# Patient Record
Sex: Female | Born: 1945
Health system: Southern US, Community
[De-identification: ages and names within clinical notes are randomized; demographics above are authoritative.]

## PROBLEM LIST (undated history)

## (undated) DIAGNOSIS — I4891 Unspecified atrial fibrillation: Secondary | ICD-10-CM

## (undated) DIAGNOSIS — I10 Essential (primary) hypertension: Secondary | ICD-10-CM

## (undated) DIAGNOSIS — I471 Supraventricular tachycardia, unspecified: Secondary | ICD-10-CM

## (undated) DIAGNOSIS — Z8679 Personal history of other diseases of the circulatory system: Secondary | ICD-10-CM

## (undated) DIAGNOSIS — E7800 Pure hypercholesterolemia, unspecified: Secondary | ICD-10-CM

## (undated) DIAGNOSIS — R911 Solitary pulmonary nodule: Secondary | ICD-10-CM

## (undated) DIAGNOSIS — J45909 Unspecified asthma, uncomplicated: Secondary | ICD-10-CM

## (undated) DIAGNOSIS — E785 Hyperlipidemia, unspecified: Secondary | ICD-10-CM

## (undated) DIAGNOSIS — M8588 Other specified disorders of bone density and structure, other site: Secondary | ICD-10-CM

## (undated) HISTORY — DX: Supraventricular tachycardia, unspecified: I47.10

## (undated) HISTORY — DX: Unspecified atrial fibrillation: I48.91

## (undated) HISTORY — DX: Pure hypercholesterolemia, unspecified: E78.00

## (undated) HISTORY — PX: TUBAL LIGATION: SHX77

## (undated) HISTORY — DX: Hyperlipidemia, unspecified: E78.5

## (undated) HISTORY — DX: Solitary pulmonary nodule: R91.1

## (undated) HISTORY — DX: Personal history of other diseases of the circulatory system: Z86.79

## (undated) HISTORY — DX: Other specified disorders of bone density and structure, other site: M85.88

## (undated) HISTORY — DX: Supraventricular tachycardia: I47.1

---

## 2008-08-06 ENCOUNTER — Encounter: Admission: RE | Admit: 2008-08-06 | Discharge: 2008-08-06 | Payer: Self-pay | Admitting: Internal Medicine

## 2008-10-28 ENCOUNTER — Other Ambulatory Visit: Admission: RE | Admit: 2008-10-28 | Discharge: 2008-10-28 | Payer: Self-pay | Admitting: Internal Medicine

## 2009-08-24 ENCOUNTER — Encounter: Admission: RE | Admit: 2009-08-24 | Discharge: 2009-08-24 | Payer: Self-pay | Admitting: Internal Medicine

## 2010-03-23 ENCOUNTER — Encounter: Admission: RE | Admit: 2010-03-23 | Discharge: 2010-03-23 | Payer: Self-pay | Admitting: Internal Medicine

## 2010-08-07 ENCOUNTER — Other Ambulatory Visit: Payer: Self-pay | Admitting: Internal Medicine

## 2010-08-07 DIAGNOSIS — Z1239 Encounter for other screening for malignant neoplasm of breast: Secondary | ICD-10-CM

## 2010-08-07 DIAGNOSIS — Z1231 Encounter for screening mammogram for malignant neoplasm of breast: Secondary | ICD-10-CM

## 2010-08-25 ENCOUNTER — Ambulatory Visit
Admission: RE | Admit: 2010-08-25 | Discharge: 2010-08-25 | Disposition: A | Discharge status: Home / Self Care | Payer: BC Managed Care – PPO | Source: Ambulatory Visit | Attending: Internal Medicine | Admitting: Internal Medicine

## 2010-08-25 DIAGNOSIS — Z1231 Encounter for screening mammogram for malignant neoplasm of breast: Secondary | ICD-10-CM

## 2011-02-08 ENCOUNTER — Other Ambulatory Visit (HOSPITAL_COMMUNITY)
Admission: RE | Admit: 2011-02-08 | Discharge: 2011-02-08 | Disposition: A | Discharge status: Home / Self Care | Payer: BC Managed Care – PPO | Source: Ambulatory Visit | Attending: Internal Medicine | Admitting: Internal Medicine

## 2011-02-08 ENCOUNTER — Other Ambulatory Visit: Payer: Self-pay | Admitting: Internal Medicine

## 2011-02-08 DIAGNOSIS — Z1159 Encounter for screening for other viral diseases: Secondary | ICD-10-CM | POA: Insufficient documentation

## 2011-02-08 DIAGNOSIS — Z01419 Encounter for gynecological examination (general) (routine) without abnormal findings: Secondary | ICD-10-CM | POA: Insufficient documentation

## 2011-07-18 ENCOUNTER — Other Ambulatory Visit: Payer: Self-pay | Admitting: Internal Medicine

## 2011-07-18 DIAGNOSIS — Z1231 Encounter for screening mammogram for malignant neoplasm of breast: Secondary | ICD-10-CM

## 2011-08-27 ENCOUNTER — Ambulatory Visit
Admission: RE | Admit: 2011-08-27 | Discharge: 2011-08-27 | Disposition: A | Discharge status: Home / Self Care | Payer: BC Managed Care – PPO | Source: Ambulatory Visit | Attending: Internal Medicine | Admitting: Internal Medicine

## 2011-08-27 DIAGNOSIS — Z1231 Encounter for screening mammogram for malignant neoplasm of breast: Secondary | ICD-10-CM

## 2012-01-07 ENCOUNTER — Emergency Department (HOSPITAL_COMMUNITY): Payer: BC Managed Care – PPO

## 2012-01-07 ENCOUNTER — Observation Stay (HOSPITAL_COMMUNITY)
Admission: EM | Admit: 2012-01-07 | Discharge: 2012-01-08 | Disposition: A | Discharge status: Home / Self Care | Payer: BC Managed Care – PPO | Attending: Internal Medicine | Admitting: Internal Medicine

## 2012-01-07 ENCOUNTER — Other Ambulatory Visit: Payer: Self-pay

## 2012-01-07 ENCOUNTER — Encounter (HOSPITAL_COMMUNITY): Payer: Self-pay | Admitting: *Deleted

## 2012-01-07 DIAGNOSIS — I471 Supraventricular tachycardia, unspecified: Secondary | ICD-10-CM | POA: Diagnosis present

## 2012-01-07 DIAGNOSIS — I10 Essential (primary) hypertension: Secondary | ICD-10-CM | POA: Diagnosis present

## 2012-01-07 DIAGNOSIS — R0602 Shortness of breath: Secondary | ICD-10-CM | POA: Insufficient documentation

## 2012-01-07 DIAGNOSIS — I498 Other specified cardiac arrhythmias: Principal | ICD-10-CM | POA: Insufficient documentation

## 2012-01-07 DIAGNOSIS — J45909 Unspecified asthma, uncomplicated: Secondary | ICD-10-CM | POA: Diagnosis present

## 2012-01-07 HISTORY — DX: Unspecified asthma, uncomplicated: J45.909

## 2012-01-07 HISTORY — DX: Essential (primary) hypertension: I10

## 2012-01-07 LAB — CBC
HCT: 38.5 % (ref 36.0–46.0)
MCH: 24.3 pg — ABNORMAL LOW (ref 26.0–34.0)
MCV: 75.9 fL — ABNORMAL LOW (ref 78.0–100.0)
RBC: 5.07 MIL/uL (ref 3.87–5.11)
RDW: 13.6 % (ref 11.5–15.5)
WBC: 8.8 10*3/uL (ref 4.0–10.5)

## 2012-01-07 LAB — DIFFERENTIAL
Basophils Absolute: 0 10*3/uL (ref 0.0–0.1)
Neutro Abs: 5.2 10*3/uL (ref 1.7–7.7)

## 2012-01-07 LAB — POCT I-STAT, CHEM 8
Creatinine, Ser: 1.3 mg/dL — ABNORMAL HIGH (ref 0.50–1.10)
Glucose, Bld: 128 mg/dL — ABNORMAL HIGH (ref 70–99)
Hemoglobin: 13.6 g/dL (ref 12.0–15.0)
Potassium: 4.3 mEq/L (ref 3.5–5.1)

## 2012-01-07 LAB — APTT: aPTT: 29 seconds (ref 24–37)

## 2012-01-07 LAB — PROTIME-INR: Prothrombin Time: 12.8 seconds (ref 11.6–15.2)

## 2012-01-07 MED ORDER — ADENOSINE 6 MG/2ML IV SOLN
INTRAVENOUS | Status: AC
  Filled 2012-01-07: qty 8

## 2012-01-07 MED ORDER — ADENOSINE 6 MG/2ML IV SOLN
12.0000 mg | Freq: Once | INTRAVENOUS | Status: AC
  Administered 2012-01-07: 12 mg via INTRAVENOUS

## 2012-01-07 MED ORDER — ASPIRIN 81 MG PO CHEW
324.0000 mg | CHEWABLE_TABLET | Freq: Once | ORAL | Status: AC
  Administered 2012-01-07: 324 mg via ORAL
  Filled 2012-01-07: qty 4

## 2012-01-07 MED ORDER — ADENOSINE 6 MG/2ML IV SOLN
6.0000 mg | Freq: Once | INTRAVENOUS | Status: AC
  Administered 2012-01-07: 6 mg via INTRAVENOUS

## 2012-01-07 NOTE — ED Provider Notes (Signed)
History     CSN: [id number]  Arrival date & time 01/07/12  2126   First MD Initiated Contact with Patient 01/07/12 2142      Chief Complaint  Patient presents with  . Tachycardia    (Consider location/radiation/quality/duration/timing/severity/associated sxs/prior treatment) HPI  66 year old female with past meniscal medical history of hypertension and asthma presents today with a 3 hour history of some shortness of breath and very rapid heart rate approximately 3 hours before arrival. The patient denies any frank chest pain, jaw pain or arm pain. The patient denies any diaphoresis. She does endorse palpitations and a sense of shivering of her heart. She has never had this before. She endorses no recent illnesses. She's had no recent cough. She calls her illness moderate. On arrival her heart rate is approximately 185 beats per minute. Her pain is 0/10. Nothing makes her symptoms better or worse.  Past Medical History  Diagnosis Date  . Hypertension   . Asthma     Past Surgical History  Procedure Date  . Tubal ligation circa 1975    Family History  Problem Relation Age of Onset  . Hypertension Mother   . Dementia Mother   . Hypertension Father   . Dementia Father   . Diabetes Father     History  Substance Use Topics  . Smoking status: Never Smoker   . Smokeless tobacco: Not on file  . Alcohol Use: No    OB History    Grav Para Term Preterm Abortions TAB SAB Ect Mult Living                  Review of Systems Constitutional: Negative for fever and chills.  HENT: Negative for ear pain, sore throat and trouble swallowing.   Eyes: Negative for pain and visual disturbance.  Respiratory: Negative for cough and POS mild shortness of breath.   Cardiovascular: Negative for chest pain and leg swelling.  Gastrointestinal: Negative for nausea, vomiting, abdominal pain and diarrhea.  Genitourinary: Negative for dysuria, urgency and frequency.  Musculoskeletal: Negative  for back pain and joint swelling.  Skin: Negative for rash and wound.  Neurological: Negative for dizziness, syncope, speech difficulty, weakness and numbness.   Allergies  Fish allergy and Milk-related compounds  Home Medications   Current Outpatient Rx  Name Route Sig Dispense Refill  . ALBUTEROL SULFATE HFA 108 (90 BASE) MCG/ACT IN AERS Inhalation Inhale 2 puffs into the lungs every 6 (six) hours as needed. For wheezing    . ASPIRIN EC 81 MG PO TBEC Oral Take 81 mg by mouth daily.    Marland Kitchen CALCIUM CARBONATE 1250 MG PO TABS Oral Take 1 tablet by mouth 3 (three) times daily.    Marland Kitchen VITAMIN D 1000 UNITS PO TABS Oral Take 1,000 Units by mouth daily.    Marland Kitchen FLUTICASONE-SALMETEROL 250-50 MCG/DOSE IN AEPB Inhalation Inhale 1 puff into the lungs every 12 (twelve) hours.    Marland Kitchen LISINOPRIL-HYDROCHLOROTHIAZIDE 20-12.5 MG PO TABS Oral Take 2 tablets by mouth daily.      BP 122/72  Pulse 94  Temp 97.7 F (36.5 C) (Oral)  Resp 11  SpO2 100%  Physical Exam Consitutional: Pt in no acute distress.   Head: Normocephalic and atraumatic.  Eyes: Extraocular motion intact, no scleral icterus Neck: Supple without meningismus, mass, or overt JVD Respiratory: Effort normal and breath sounds normal. No respiratory distress. [id number], no obvious murmurs.  Pulses +2 and symmetric Abdomen: Soft, non-tender, non-distended. No rebound or guarding.  MSK: Extremities  are atraumatic without deformity, ROM intact Skin: Warm, dry, intact Neuro: Alert and oriented, no motor deficit noted.   Psychiatric: Mood and affect are normal  EKG:  Rate:  195 Rythym SVT  Interval: n/a Axis: normal No gross conduction abnormalities appreciated.  STD V3-V6.       ED Course  Procedures (including critical care time)  Labs Reviewed  CBC - Abnormal; Notable for the following:    MCV 75.9 (*)     MCH 24.3 (*)     All other components within normal limits  POCT I-STAT, CHEM 8 - Abnormal; Notable for the following:     Creatinine, Ser 1.30 (*)     Glucose, Bld 128 (*)     All other components within normal limits  DIFFERENTIAL  PROTIME-INR  APTT  POCT I-STAT TROPONIN I  TSH  CARDIAC PANEL(CRET KIN+CKTOT+MB+TROPI)  CARDIAC PANEL(CRET KIN+CKTOT+MB+TROPI)  CARDIAC PANEL(CRET KIN+CKTOT+MB+TROPI)  TSH   Dg Chest Portable 1 View  01/07/2012  *RADIOLOGY REPORT*  Clinical Data: Tachycardia  PORTABLE CHEST - 1 VIEW  Comparison: 03/23/2010  Findings: Mild hyperinflation.  Normal heart size and pulmonary vascularity.  No focal airspace consolidation in the lungs.  No blunting of costophrenic angles.  No pneumothorax.  No significant change since previous study.  IMPRESSION: No evidence of active pulmonary disease.  Original Report Authenticated By: Neale Burly, M.D.    Impression: SVT.    MDM  Initial EKG was consistent with a supraventricular tachycardia with some lateral ischemia likely secondary to the very fast heart rate of 195 beats per minute. However the patient had no discomfort. 6 mg of adenosine were given after failure of medical maneuvers.  She recently converted, and she once again is tachycardic. Additional 12 mg adenosine was given this he was successfully shortly, and the patient converted into SVT. Her rate eventually moderated.  Her laboratory values and chest x-ray were completed and were unremarkable other than mildly elevated Cr.  Patient was admitted to medicine uneventfully.          Edwin Cap, MD 01/08/12 0110

## 2012-01-07 NOTE — ED Notes (Signed)
The pt has had some chest pain with sob and a rapid heart rate this sfternoon.

## 2012-01-07 NOTE — ED Notes (Signed)
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 Pt on monitors, defibrillator at bedside. Dr Karle Starch at bedside.

## 2012-01-08 ENCOUNTER — Encounter (HOSPITAL_COMMUNITY): Payer: Self-pay | Admitting: Internal Medicine

## 2012-01-08 DIAGNOSIS — I471 Supraventricular tachycardia: Secondary | ICD-10-CM | POA: Diagnosis present

## 2012-01-08 DIAGNOSIS — J45909 Unspecified asthma, uncomplicated: Secondary | ICD-10-CM | POA: Diagnosis present

## 2012-01-08 DIAGNOSIS — I10 Essential (primary) hypertension: Secondary | ICD-10-CM

## 2012-01-08 HISTORY — DX: Essential (primary) hypertension: I10

## 2012-01-08 LAB — CARDIAC PANEL(CRET KIN+CKTOT+MB+TROPI)
CK, MB: 4.7 ng/mL — ABNORMAL HIGH (ref 0.3–4.0)
Relative Index: 2.5 (ref 0.0–2.5)
Relative Index: 2.6 — ABNORMAL HIGH (ref 0.0–2.5)
Relative Index: 2.9 — ABNORMAL HIGH (ref 0.0–2.5)
Total CK: 188 U/L — ABNORMAL HIGH (ref 7–177)
Total CK: 190 U/L — ABNORMAL HIGH (ref 7–177)

## 2012-01-08 LAB — COMPREHENSIVE METABOLIC PANEL
AST: 25 U/L (ref 0–37)
BUN: 18 mg/dL (ref 6–23)
CO2: 26 mEq/L (ref 19–32)
Chloride: 103 mEq/L (ref 96–112)
Creatinine, Ser: 1.09 mg/dL (ref 0.50–1.10)
GFR calc Af Amer: 59 mL/min — ABNORMAL LOW (ref >90)
GFR calc non Af Amer: 51 mL/min — ABNORMAL LOW (ref >90)
Glucose, Bld: 87 mg/dL (ref 70–99)
Total Bilirubin: 0.4 mg/dL (ref 0.3–1.2)

## 2012-01-08 LAB — TSH: TSH: 3.109 u[IU]/mL (ref 0.350–4.500)

## 2012-01-08 MED ORDER — SODIUM CHLORIDE 0.9 % IV SOLN: INTRAVENOUS | Status: AC

## 2012-01-08 MED ORDER — SODIUM CHLORIDE 0.9 % IV SOLN
INTRAVENOUS | Status: DC
  Administered 2012-01-08: 02:00:00 via INTRAVENOUS

## 2012-01-08 MED ORDER — ASPIRIN EC 81 MG PO TBEC
81.0000 mg | DELAYED_RELEASE_TABLET | Freq: Every day | ORAL | Status: DC
  Administered 2012-01-08: 81 mg via ORAL
  Filled 2012-01-08: qty 1

## 2012-01-08 MED ORDER — FLUTICASONE-SALMETEROL 250-50 MCG/DOSE IN AEPB
1.0000 | INHALATION_SPRAY | Freq: Two times a day (BID) | RESPIRATORY_TRACT | Status: DC
  Administered 2012-01-08: 1 via RESPIRATORY_TRACT
  Filled 2012-01-08: qty 14

## 2012-01-08 MED ORDER — DILTIAZEM HCL ER COATED BEADS 120 MG PO CP24: 120.0000 mg | ORAL_CAPSULE | Freq: Every day | ORAL | Status: DC

## 2012-01-08 MED ORDER — ALBUTEROL SULFATE HFA 108 (90 BASE) MCG/ACT IN AERS: 2.0000 | INHALATION_SPRAY | Freq: Four times a day (QID) | RESPIRATORY_TRACT | Status: DC | PRN

## 2012-01-08 MED ORDER — VITAMIN D3 25 MCG (1000 UNIT) PO TABS
1000.0000 [IU] | ORAL_TABLET | Freq: Every day | ORAL | Status: DC
  Administered 2012-01-08: 1000 [IU] via ORAL
  Filled 2012-01-08: qty 1

## 2012-01-08 MED ORDER — DILTIAZEM HCL ER COATED BEADS 120 MG PO CP24
120.0000 mg | ORAL_CAPSULE | Freq: Every day | ORAL | Status: DC
  Administered 2012-01-08: 120 mg via ORAL
  Filled 2012-01-08: qty 1

## 2012-01-08 MED ORDER — CALCIUM CARBONATE 1250 (500 CA) MG PO TABS
1.0000 | ORAL_TABLET | Freq: Three times a day (TID) | ORAL | Status: DC
  Administered 2012-01-08 (×3): 500 mg via ORAL
  Filled 2012-01-08 (×4): qty 1

## 2012-01-08 NOTE — Consult Note (Addendum)
Admit date: 01/07/2012 Referring Physician  : Dr. Grandville Silos Primary Physician Kandice Hams, MD Primary Cardiologist  None Reason for Consultation  SVT  HPI: 66 year old female with no prior cardiovascular history, with a history of prior asthma, hypertension who had newly discovered supraventricular tachycardia yesterday in the emergency department requiring adenosine.   Over the weekend she drove to [place] to help her sister move and had no difficulty. 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 She sat down drink some water and try to calm down. She then became weaker and weaker. She then realized that her heart was beating quickly. She tried to take her blood pressure but then called her daughter and told her that she should go to the emergency room. Once in the emergency room an EKG was performed which showed a heart rate of 195 beats per minute with diffuse ST segment depression, no clear P waves preceding QRS complex, narrow complex tachycardia. She was given adenosine 6 mg and converted. She states that shortly after this she required a second 12 mg dose. She was placed on diltiazem 120 mg long-acting oral. Her lisinopril hydrochlorothiazide was held.  She does not drink alcohol, no recent viral illnesses, no cough, no fevers. She is not taking any Sudafed or other decongestants. She is currently feeling well.  Her lab work demonstrated a normal potassium of 4.0, creatinine 0.09, albumin 3.4, liver functions otherwise normal. Her troponin has been normal x3. Her CK was 190 and her MB was 5.0 mildly elevated. Her white count was 8.8, normal and her hemoglobin was 13.6. TSH was normal at 3.1. Chest x-ray showed no active disease    PMH:   Past Medical History  Diagnosis Date  . Hypertension   . Asthma   . HTN (hypertension) 01/08/2012    PSH:   Past Surgical History  Procedure Date  . Tubal ligation  circa 1975   Allergies:  Fish allergy and Milk-related compounds Prior to Admit Meds:   Prescriptions prior to admission  Medication Sig Dispense Refill  . albuterol (PROVENTIL HFA;VENTOLIN HFA) 108 (90 BASE) MCG/ACT inhaler Inhale 2 puffs into the lungs every 6 (six) hours as needed. For wheezing      . aspirin EC 81 MG tablet Take 81 mg by mouth daily.      . calcium carbonate (OS-CAL - DOSED IN MG OF ELEMENTAL CALCIUM) 1250 MG tablet Take 1 tablet by mouth 3 (three) times daily.      . cholecalciferol (VITAMIN D) 1000 UNITS tablet Take 1,000 Units by mouth daily.      . Fluticasone-Salmeterol (ADVAIR) 250-50 MCG/DOSE AEPB Inhale 1 puff into the lungs every 12 (twelve) hours.      Marland Kitchen lisinopril-hydrochlorothiazide (PRINZIDE,ZESTORETIC) 20-12.5 MG per tablet Take 2 tablets by mouth daily.       Fam HX:    Family History  Problem Relation Age of Onset  . Hypertension Mother   . Dementia Mother   . Hypertension Father   . Dementia Father   . Diabetes Father    no early family history of coronary artery disease Social HX:    History   Social History  . Marital Status: Unknown    Spouse Name: N/A    Number of Children: N/A  . Years of Education: N/A   Occupational History  . Not on file.   Social History Main Topics  . Smoking status: Never Smoker   . Smokeless  tobacco: Not on file  . Alcohol Use: No  . Drug Use: No  . Sexually Active: Not on file   Other Topics Concern  . 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 rate 16, height 5\' 1"  (1.549 m), weight 71.7 kg (158 lb 1.1 oz), SpO2 99.00%.    General: Well developed, well nourished, in no acute distress Head: Eyes PERRLA, No xanthomas.    Normal cephalic and atramatic  Lungs:   Clear bilaterally to auscultation and percussion. Normal respiratory effort. No wheezes, no rales. Heart:   HRRR S1 S2 Pulses are 2+ & equal. No appreciable murmurs.            No carotid bruit. No JVD.  No abdominal bruits. No femoral bruits. Abdomen: Bowel sounds are positive, abdomen soft and non-tender without masses. No hepatosplenomegaly. Msk:  Back normal. Normal strength and tone for age. Extremities:   No clubbing, cyanosis or edema.  DP +1 Neuro: Alert and oriented X 3, non-focal, MAE x 4 GU: Deferred Rectal: Deferred Psych:  Good affect, responds appropriately    Labs:   Lab Results  Component Value Date   WBC 8.8 01/07/2012   HGB 13.6 01/07/2012   HCT 40.0 01/07/2012   MCV 75.9* 01/07/2012   PLT 211 01/07/2012    Lab 01/08/12 0655  NA 138  K 4.0  CL 103  CO2 26  BUN 18  CREATININE 1.09  CALCIUM 9.7  PROT 7.0  BILITOT 0.4  ALKPHOS 75  ALT 13  AST 25  GLUCOSE 87   No results found for this basename: PTT   Lab Results  Component Value Date   INR 0.94 01/07/2012   Lab Results  Component Value Date   CKTOTAL 190* 01/08/2012   CKMB 5.0* 01/08/2012   TROPONINI <0.30 01/08/2012      Radiology:  Dg Chest Portable 1 View  01/07/2012  *RADIOLOGY REPORT*  Clinical Data: Tachycardia  PORTABLE CHEST - 1 VIEW  Comparison: 03/23/2010  Findings: Mild hyperinflation.  Normal heart size and pulmonary vascularity.  No focal airspace consolidation in the lungs.  No blunting of costophrenic angles.  No pneumothorax.  No significant change since previous study.  IMPRESSION: No evidence of active pulmonary disease.  Original Report Authenticated By: Neale Burly, M.D.   Personally viewed.  EKG:  As described above, rightward axis narrow complex QRS, diffuse ST segment depression, no clear P waves, perhaps small retrograde P wave noted very difficult to discern. Heart rate 195.  Followup EKG shows sinus tachycardia rate 121 with  nonspecific ST-T wave changes, possible repolarization abnormality in the lateral leads. Normal axis. Personally viewed.   Currently telemetry shows sinus rhythm with heart rate in the 60s.  ASSESSMENT/PLAN:   66 year old female with prior history of asthma, newly discovered supraventricular tachycardia which responded to adenosine.  Supraventricular tachycardia-SVT-I agree with current use of diltiazem CD 120 mg once a day. This is her first experience with SVT requiring hospitalization. She may have had brief episodes of palpitations in the past but she is unsure. Her episode yesterday was accompanied by diaphoresis and a sensation of her heart racing. Thus far, troponin is normal. TSH is normal.  No obvious electrolyte abnormalities. Currently waiting echocardiogram however this may be performed as an outpatient. Will discuss with her primary caregiver here in the hospital, Dr. Grandville Silos.  If she has repeat episodes on medication, I will likely refer to electrophysiology for ablative procedure. I've instructed her to avoid decongestants such as Sudafed.  Close followup has been obtained with the office. I will also obtain an echocardiogram tomorrow morning to assess for proper structure and function. She knows to contact me if any further worrisome symptoms develop in relation to her heart.  Hypertension-her other antihypertensives have been discontinued because of new start diltiazem. Continue to monitor. She may need reinitiation of these medications. I would have her followup with her primary physician, Dr. Delfina Redwood in the future.  Candee Furbish, MD  01/08/2012  12:50 PM    I've spoken with Dr. Grandville Silos and I'm fine for her to be discharged given her stability. As is stated above, I have set up an echocardiogram and followup as well. I discussed this with the patient and she is fine with this plan.

## 2012-01-08 NOTE — H&P (Signed)
Helen Stanton is an 66 y.o. female.   Chief Complaint: palpitations HPI: 66 yo female with htn with complaints of diaphoresis, feeling weak and then palpitations at about 6:30pm .  Pt notes slight cp, fleeting, right sided and also sob.  Pt was brought by her daughter to the ED and found to be in SVT  And tx with adenosine 6mg  and then had recurrent SVT with hr 190's and tx with adenosine 12mg  iv x1 and then converted to NSR. Pt denies excessive caffeine use, or extra albuterol use,  Pt will be admitted for SVT.   Past Medical History  Diagnosis Date  . Hypertension   . Asthma     Past Surgical History  Procedure Date  . Tubal ligation circa 1975    Family History  Problem Relation Age of Onset  . Hypertension Mother   . Dementia Mother   . Hypertension Father   . Dementia Father   . Diabetes Father    Social History:  reports that she has never smoked. She does not have any smokeless tobacco history on file. She reports that she does not drink alcohol or use illicit drugs.  Allergies:  Allergies  Allergen Reactions  . Fish Allergy Shortness Of Breath  . Milk-Related Compounds Shortness Of Breath     (Not in a hospital admission)  Results for orders placed during the hospital encounter of 01/07/12 (from the past 48 hour(s))  CBC     Status: Abnormal   Collection Time   01/07/12 10:00 PM      Component Value Range Comment   WBC 8.8  4.0 - 10.5 K/uL    RBC 5.07  3.87 - 5.11 MIL/uL    Hemoglobin 12.3  12.0 - 15.0 g/dL    HCT 38.5  36.0 - 46.0 %    MCV 75.9 (*) 78.0 - 100.0 fL    MCH 24.3 (*) 26.0 - 34.0 pg    MCHC 31.9  30.0 - 36.0 g/dL    RDW 13.6  11.5 - 15.5 %    Platelets 211  150 - 400 K/uL   DIFFERENTIAL     Status: Normal   Collection Time   01/07/12 10:00 PM      Component Value Range Comment   Neutrophils Relative 60  43 - 77 %    Neutro Abs 5.2  1.7 - 7.7 K/uL    Lymphocytes Relative 30  12 - 46 %    Lymphs Abs 2.7  0.7 - 4.0 K/uL    Monocytes Relative 6   3 - 12 %    Monocytes Absolute 0.6  0.1 - 1.0 K/uL    Eosinophils Relative 4  0 - 5 %    Eosinophils Absolute 0.3  0.0 - 0.7 K/uL    Basophils Relative 0  0 - 1 %    Basophils Absolute 0.0  0.0 - 0.1 K/uL   PROTIME-INR     Status: Normal   Collection Time   01/07/12 10:00 PM      Component Value Range Comment   Prothrombin Time 12.8  11.6 - 15.2 seconds    INR 0.94  0.00 - 1.49   APTT     Status: Normal   Collection Time   01/07/12 10:00 PM      Component Value Range Comment   aPTT 29  24 - 37 seconds   POCT I-STAT, CHEM 8     Status: Abnormal   Collection Time   01/07/12 10:16  PM      Component Value Range Comment   Sodium 138  135 - 145 mEq/L    Potassium 4.3  3.5 - 5.1 mEq/L    Chloride 102  96 - 112 mEq/L    BUN 21  6 - 23 mg/dL    Creatinine, Ser 1.30 (*) 0.50 - 1.10 mg/dL    Glucose, Bld 128 (*) 70 - 99 mg/dL    Calcium, Ion 1.26  1.12 - 1.32 mmol/L    TCO2 25  0 - 100 mmol/L    Hemoglobin 13.6  12.0 - 15.0 g/dL    HCT 40.0  36.0 - 46.0 %   POCT I-STAT TROPONIN I     Status: Normal   Collection Time   01/07/12 11:26 PM      Component Value Range Comment   Troponin i, poc 0.03  0.00 - 0.08 ng/mL    Comment 3             Dg Chest Portable 1 View  01/07/2012  *RADIOLOGY REPORT*  Clinical Data: Tachycardia  PORTABLE CHEST - 1 VIEW  Comparison: 03/23/2010  Findings: Mild hyperinflation.  Normal heart size and pulmonary vascularity.  No focal airspace consolidation in the lungs.  No blunting of costophrenic angles.  No pneumothorax.  No significant change since previous study.  IMPRESSION: No evidence of active pulmonary disease.  Original Report Authenticated By: Neale Burly, M.D.    Review of Systems  Constitutional: Negative for fever, chills, weight loss, malaise/fatigue and diaphoresis.  HENT: Negative for hearing loss, ear pain, nosebleeds, congestion, sore throat, neck pain, tinnitus and ear discharge.   Eyes: Negative for blurred vision, double vision,  photophobia, pain, discharge and redness.  Respiratory: Positive for shortness of breath. Negative for cough, hemoptysis, sputum production, wheezing and stridor.   Cardiovascular: Positive for chest pain and palpitations. Negative for orthopnea, claudication, leg swelling and PND.  Gastrointestinal: Negative for heartburn, nausea, vomiting, abdominal pain, diarrhea, constipation, blood in stool and melena.  Genitourinary: Negative for dysuria, urgency, frequency, hematuria and flank pain.  Musculoskeletal: Negative for myalgias, back pain, joint pain and falls.  Skin: Negative for itching and rash.  Neurological: Negative for dizziness, tingling, tremors, sensory change, speech change, focal weakness, seizures, loss of consciousness, weakness and headaches.  Endo/Heme/Allergies: Negative for environmental allergies and polydipsia. Does not bruise/bleed easily.  Psychiatric/Behavioral: Negative for depression, suicidal ideas, hallucinations, memory loss and substance abuse. The patient is not nervous/anxious and does not have insomnia.     Blood pressure 111/70, pulse 94, temperature 97.7 F (36.5 C), temperature source Oral, resp. rate 11, SpO2 100.00%. Physical Exam  Constitutional: She is oriented to person, place, and time. She appears well-developed and well-nourished. No distress.  HENT:  Head: Normocephalic and atraumatic.  Nose: Nose normal.  Mouth/Throat: No oropharyngeal exudate.  Eyes: Conjunctivae and EOM are normal. Pupils are equal, round, and reactive to light. Right eye exhibits no discharge. Left eye exhibits no discharge. No scleral icterus.  Neck: Normal range of motion. Neck supple. No JVD present. No tracheal deviation present. No thyromegaly present.  Cardiovascular: Normal rate, regular rhythm and normal heart sounds.  Exam reveals no gallop and no friction rub.   No murmur heard. Respiratory: Effort normal and breath sounds normal. No stridor. No respiratory distress.  She has no wheezes. She has no rales. She exhibits no tenderness.  GI: Soft. Bowel sounds are normal. She exhibits no distension and no mass. There is no tenderness. There is  no rebound and no guarding.  Musculoskeletal: Normal range of motion. She exhibits no edema and no tenderness.  Lymphadenopathy:    She has no cervical adenopathy.  Neurological: She is alert and oriented to person, place, and time. She has normal reflexes. She displays normal reflexes. No cranial nerve deficit. She exhibits normal muscle tone. Coordination normal.  Skin: Skin is warm and dry. No rash noted. She is not diaphoretic. No erythema. No pallor.  Psychiatric: She has a normal mood and affect. Her behavior is normal. Judgment and thought content normal.     Assessment/Plan SVT Hypertension Asthma  Tele TSH Check echo Check cpk, mb, trop i q6h x 3  Cont Advair D/c lisinopril hydrochlorothiazide and will implement cardizem 90mg  po qday.    Jani Gravel 01/08/2012, 12:35 AM

## 2012-01-08 NOTE — Discharge Summary (Signed)
Discharge Summary  Brie Gowell MR#: [id number]  DOB:01-29-1946  Date of Admission: 01/07/2012 Date of Discharge: 01/08/2012  Patient's PCP: Kandice Hams, MD  Attending Physician:Nayleen Janosik  Consults: Treatment Team: #1 cardiology:Mark Marlou Porch, MD   Discharge Diagnoses: SVT (supraventricular tachycardia) Present on Admission:  .SVT (supraventricular tachycardia) .HTN (hypertension) .Asthma   Brief Admitting History and Physical 66 yo female with htn with complaints of diaphoresis, feeling weak and then palpitations at about 6:30pm . Pt notes slight cp, fleeting, right sided and also sob. Pt was brought by her daughter to the ED and found to be in SVT And tx with adenosine 6mg  and then had recurrent SVT with hr 190's and tx with adenosine 12mg  iv x1 and then converted to NSR. Pt denies excessive caffeine use, or extra albuterol use, Pt will be admitted for SVT. For the rest of admission history and physical please see H&P dictated by Dr. Maudie Mercury.   Discharge Medications Medication List  As of 01/08/2012  9:26 PM   START taking these medications         diltiazem 120 MG 24 hr capsule   Commonly known as: CARDIZEM CD   Take 1 capsule (120 mg total) by mouth daily.         CONTINUE taking these medications         albuterol 108 (90 BASE) MCG/ACT inhaler   Commonly known as: PROVENTIL HFA;VENTOLIN HFA      aspirin EC 81 MG tablet      calcium carbonate 1250 MG tablet   Commonly known as: OS-CAL - dosed in mg of elemental calcium      cholecalciferol 1000 UNITS tablet   Commonly known as: VITAMIN D      Fluticasone-Salmeterol 250-50 MCG/DOSE Aepb   Commonly known as: ADVAIR         STOP taking these medications         lisinopril-hydrochlorothiazide 20-12.5 MG per tablet          Where to get your medications    These are the prescriptions that you need to pick up.   You may get these medications from any pharmacy.         diltiazem 120 MG 24 hr capsule           Hospital Course: #1:SVT (supraventricular tachycardia) Patient was admitted an SVT. Patient had to be given 2 doses of adenosine and subsequently converted into normal sinus rhythm.patient was placed on a telemetry monitor. Cardiac enzymes were cycled which were negative x3.TSH which was obtained came back within normal limits at 3.109. Patient did not have any electrolyte abnormalities.Patient was placed on diltiazem 120 mg daily and a cardiology consultation was obtained. Patient was seen in consultation by Dr. Candee Furbish of [facility] cardiology whom evaluated the patient.cardiology upon evaluation felt that patient was in hemodynamically stable condition had converted to normal sinus rhythm and had been stable on the telemetry monitor. Cardiology felt patient was safe to be discharged home and has been set up for outpatient 2-D echo to be done tomorrow with close followup with Dr Marlou Porch of [facility] cardiology as outpatient. Patient's antihypertensive medications were discontinued because she had been started on diltiazem. Patient will  followup with her PCP as outpatient one week post discharge.patient be discharged in stable and improved condition. The rest of patient's chronic medical issues remained stable.   Present on Admission:  .SVT (supraventricular tachycardia) .HTN (hypertension) .Asthma   Day of Discharge BP 123/74  Pulse 59  Temp  96.7 F (35.9 C) (Oral)  Resp 18  Ht 5\' 1"  (1.549 m)  Wt 71.7 kg (158 lb 1.1 oz)  BMI 29.87 kg/m2  SpO2 99% See progress note. Results for orders placed during the hospital encounter of 01/07/12 (from the past 48 hour(s))  CBC     Status: Abnormal   Collection Time   01/07/12 10:00 PM      Component Value Range Comment   WBC 8.8  4.0 - 10.5 K/uL    RBC 5.07  3.87 - 5.11 MIL/uL    Hemoglobin 12.3  12.0 - 15.0 g/dL    HCT 38.5  36.0 - 46.0 %    MCV 75.9 (*) 78.0 - 100.0 fL    MCH 24.3 (*) 26.0 - 34.0 pg    MCHC 31.9  30.0 - 36.0 g/dL     RDW 13.6  11.5 - 15.5 %    Platelets 211  150 - 400 K/uL   DIFFERENTIAL     Status: Normal   Collection Time   01/07/12 10:00 PM      Component Value Range Comment   Neutrophils Relative 60  43 - 77 %    Neutro Abs 5.2  1.7 - 7.7 K/uL    Lymphocytes Relative 30  12 - 46 %    Lymphs Abs 2.7  0.7 - 4.0 K/uL    Monocytes Relative 6  3 - 12 %    Monocytes Absolute 0.6  0.1 - 1.0 K/uL    Eosinophils Relative 4  0 - 5 %    Eosinophils Absolute 0.3  0.0 - 0.7 K/uL    Basophils Relative 0  0 - 1 %    Basophils Absolute 0.0  0.0 - 0.1 K/uL   PROTIME-INR     Status: Normal   Collection Time   01/07/12 10:00 PM      Component Value Range Comment   Prothrombin Time 12.8  11.6 - 15.2 seconds    INR 0.94  0.00 - 1.49   APTT     Status: Normal   Collection Time   01/07/12 10:00 PM      Component Value Range Comment   aPTT 29  24 - 37 seconds   POCT I-STAT, CHEM 8     Status: Abnormal   Collection Time   01/07/12 10:16 PM      Component Value Range Comment   Sodium 138  135 - 145 mEq/L    Potassium 4.3  3.5 - 5.1 mEq/L    Chloride 102  96 - 112 mEq/L    BUN 21  6 - 23 mg/dL    Creatinine, Ser 1.30 (*) 0.50 - 1.10 mg/dL    Glucose, Bld 128 (*) 70 - 99 mg/dL    Calcium, Ion 1.26  1.12 - 1.32 mmol/L    TCO2 25  0 - 100 mmol/L    Hemoglobin 13.6  12.0 - 15.0 g/dL    HCT 40.0  36.0 - 46.0 %   POCT I-STAT TROPONIN I     Status: Normal   Collection Time   01/07/12 11:26 PM      Component Value Range Comment   Troponin i, poc 0.03  0.00 - 0.08 ng/mL    Comment 3            CARDIAC PANEL(CRET KIN+CKTOT+MB+TROPI)     Status: Abnormal   Collection Time   01/08/12 12:50 AM      Component Value Range Comment   Total  CK 188 (*) 7 - 177 U/L    CK, MB 4.7 (*) 0.3 - 4.0 ng/mL    Troponin I <0.30  <0.30 ng/mL    Relative Index 2.5  0.0 - 2.5   TSH     Status: Normal   Collection Time   01/08/12  1:09 AM      Component Value Range Comment   TSH 3.109  0.350 - 4.500 uIU/mL   CARDIAC PANEL(CRET  KIN+CKTOT+MB+TROPI)     Status: Abnormal   Collection Time   01/08/12  6:55 AM      Component Value Range Comment   Total CK 190 (*) 7 - 177 U/L    CK, MB 5.0 (*) 0.3 - 4.0 ng/mL    Troponin I <0.30  <0.30 ng/mL    Relative Index 2.6 (*) 0.0 - 2.5   COMPREHENSIVE METABOLIC PANEL     Status: Abnormal   Collection Time   01/08/12  6:55 AM      Component Value Range Comment   Sodium 138  135 - 145 mEq/L    Potassium 4.0  3.5 - 5.1 mEq/L    Chloride 103  96 - 112 mEq/L    CO2 26  19 - 32 mEq/L    Glucose, Bld 87  70 - 99 mg/dL    BUN 18  6 - 23 mg/dL    Creatinine, Ser 1.09  0.50 - 1.10 mg/dL    Calcium 9.7  8.4 - 10.5 mg/dL    Total Protein 7.0  6.0 - 8.3 g/dL    Albumin 3.4 (*) 3.5 - 5.2 g/dL    AST 25  0 - 37 U/L    ALT 13  0 - 35 U/L    Alkaline Phosphatase 75  39 - 117 U/L    Total Bilirubin 0.4  0.3 - 1.2 mg/dL    GFR calc non Af Amer 51 (*) >90 mL/min    GFR calc Af Amer 59 (*) >90 mL/min   CARDIAC PANEL(CRET KIN+CKTOT+MB+TROPI)     Status: Abnormal   Collection Time   01/08/12 12:50 PM      Component Value Range Comment   Total CK 169  7 - 177 U/L    CK, MB 4.9 (*) 0.3 - 4.0 ng/mL    Troponin I <0.30  <0.30 ng/mL    Relative Index 2.9 (*) 0.0 - 2.5     Dg Chest Portable 1 View  01/07/2012  *RADIOLOGY REPORT*  Clinical Data: Tachycardia  PORTABLE CHEST - 1 VIEW  Comparison: 03/23/2010  Findings: Mild hyperinflation.  Normal heart size and pulmonary vascularity.  No focal airspace consolidation in the lungs.  No blunting of costophrenic angles.  No pneumothorax.  No significant change since previous study.  IMPRESSION: No evidence of active pulmonary disease.  Original Report Authenticated By: Neale Burly, M.D.     Disposition: Home  Diet: Low heart healthy diet  Activity: increase activity slowly   Follow-up Appts: Discharge Orders    Future Orders Please Complete By Expires   Diet - low sodium heart healthy      Increase activity slowly      Discharge  instructions      Comments:   Follow up with POLITE,RONALD D, MD in 1 week Follow up with Dr Marlou Porch as scheduled      TESTS THAT NEED FOLLOW-UP   Time spent on discharge, talking to the patient, and coordinating care: 55 mins.   SignedIrine Seal 01/08/2012, 9:26  PM  

## 2012-01-08 NOTE — Progress Notes (Signed)
Subjective: Patient denies any shortness of breath. No palpitations. No chest pain. Patient feels better. Patient noted to be normal sinus rhythm on telemetry with heart rate as high as 80s.  Objective: Vital signs in last 24 hours: Filed Vitals:   01/08/12 0130 01/08/12 0201 01/08/12 0500 01/08/12 0806  BP: 133/73 142/78    Pulse:  78  62  Temp:  97.5 F (36.4 C)    TempSrc:  Oral    Resp: 17 16  16   Height:   5\' 1"  (1.549 m)   Weight:   71.7 kg (158 lb 1.1 oz)   SpO2:  99%     No intake or output data in the 24 hours ending 01/08/12 1053  Weight change:   General: Alert, awake, oriented x3, in no acute distress. HEENT: No bruits, no goiter. Heart: Regular rate and rhythm, without murmurs, rubs, gallops. Lungs: Clear to auscultation bilaterally. Abdomen: Soft, nontender, nondistended, positive bowel sounds. Extremities: No clubbing cyanosis or edema with positive pedal pulses. Neuro: Grossly intact, nonfocal.    Lab Results:  [facility] 01/08/12 0655 01/07/12 2216  NA 138 138  K 4.0 4.3  CL 103 102  CO2 26 --  GLUCOSE 87 128*  BUN 18 21  CREATININE 1.09 1.30*  CALCIUM 9.7 --  MG -- --  PHOS -- --    Basename 01/08/12 0655  AST 25  ALT 13  ALKPHOS 75  BILITOT 0.4  PROT 7.0  ALBUMIN 3.4*   No results found for this basename: LIPASE:2,AMYLASE:2 in the last 72 hours  Basename 01/07/12 2216 01/07/12 2200  WBC -- 8.8  NEUTROABS -- 5.2  HGB 13.6 12.3  HCT 40.0 38.5  MCV -- 75.9*  PLT -- 211    Basename 01/08/12 0655 01/08/12 0050  CKTOTAL 190* 188*  CKMB 5.0* 4.7*  CKMBINDEX -- --  TROPONINI <0.30 <0.30   No components found with this basename: POCBNP:3 No results found for this basename: DDIMER:2 in the last 72 hours No results found for this basename: HGBA1C:2 in the last 72 hours No results found for this basename: CHOL:2,HDL:2,LDLCALC:2,TRIG:2,CHOLHDL:2,LDLDIRECT:2 in the last 72 hours No results found for this basename:  TSH,T4TOTAL,FREET3,T3FREE,THYROIDAB in the last 72 hours No results found for this basename: VITAMINB12:2,FOLATE:2,FERRITIN:2,TIBC:2,IRON:2,RETICCTPCT:2 in the last 72 hours  Micro Results: No results found for this or any previous visit (from the past 240 hour(s)).  Studies/Results: Dg Chest Portable 1 View  01/07/2012  *RADIOLOGY REPORT*  Clinical Data: Tachycardia  PORTABLE CHEST - 1 VIEW  Comparison: 03/23/2010  Findings: Mild hyperinflation.  Normal heart size and pulmonary vascularity.  No focal airspace consolidation in the lungs.  No blunting of costophrenic angles.  No pneumothorax.  No significant change since previous study.  IMPRESSION: No evidence of active pulmonary disease.  Original Report Authenticated By: Neale Burly, M.D.    Medications:     . sodium chloride   Intravenous STAT  . adenosine (ADENOCARD) IV  12 mg Intravenous Once  . adenosine (ADENOCARD) IV  6 mg Intravenous Once  . aspirin  324 mg Oral Once  . aspirin EC  81 mg Oral Daily  . calcium carbonate  1 tablet Oral TID WC  . cholecalciferol  1,000 Units Oral Daily  . diltiazem  120 mg Oral Daily  . Fluticasone-Salmeterol  1 puff Inhalation Q12H  . DISCONTD: adenosine        Assessment: Principal Problem:  *SVT (supraventricular tachycardia) Active Problems:  HTN (hypertension)  Asthma   Plan:  #  1 SVT Questionable etiology. Patient had to be given adenosine twice yesterday for her recurrent SVT. Patient currently in normal sinus rhythm with heart rate up as high as  80s on telemetry. Cardiac enzymes have been negative x2. 2-D echo is currently pending. TSH is pending. Chest x-ray is negative for any acute infiltrate. Patient with no signs or symptoms of any acute infection. H&H is stable at 13.6. Continue oral Cardizem at 120 mg daily. We'll consult with cardiology for further evaluation and management. If SVT recurs may need to consider ablation. Will defer to cardiology.  #2  hypertension Patient's home regimen of lisinopril HCTZ has been discontinued as patient is currently on Cardizem 120 mg daily. Monitor blood pressure. If need be and pulse allowing may increase Cardizem or resume lisinopril.  #3 asthma Stable. Albuterol when necessary   LOS: 1 day   [id number] 01/08/2012, 10:53 AM

## 2012-01-08 NOTE — Discharge Instructions (Signed)
Supraventricular Tachycardia Supraventricular tachycardia (SVT) is when the heart beats very fast. SVT can last for a long time (sustained) or it can start and stop suddenly (nonsustained). HOME CARE   Take your heart medicine as told by your doctor. Check with your doctor before taking cold, diet, or herbal medicine.   Do not smoke.   Do not drink large amounts of caffeine.Caffeine is found in coffee, tea, soda (pop, cola), and chocolate.   Keep all doctor visits as told.  GET HELP RIGHT AWAY IF:   You have chest pain or pressure.   You cannot catch your breath.   You are dizzy or lightheaded.   You feel like you will pass out (faint).   You are sweaty (diaphoretic) and feel sick to your stomach (nauseous) or throw up (vomit).   If you have the above problems, call your local emergency services (911 in [place] right away. Do not drive yourself to the hospital. MAKE SURE YOU:   Understand these instructions.   Will watch your condition.   Will get help right away if you are not doing well or get worse.  Document Released: 07/02/2005 Document Revised: 06/21/2011 Document Reviewed: 10/06/2008 [facility] Patient Information 2012 [place].

## 2012-01-08 NOTE — ED Provider Notes (Signed)
I saw and evaluated the patient, reviewed the resident's note and I agree with the findings and plan. Agree with EKG interpretation  Pt arrives in SVT with ischemic changes. No pain at present. Converted with Adenosine and then recurred. Converted again with adenosine and remained in NSR with no ischemic changes. Plan admit for further eval.   Caisley Baxendale B. Karle Starch, MD 01/08/12 [id number]

## 2012-01-08 NOTE — ED Notes (Signed)
Report called pt ready for transfer to floor

## 2012-01-08 NOTE — Progress Notes (Signed)
Pt/family given discharge instructions, medication lists, follow up appointments, and when to call the doctor.  Pt/family verbalizes understanding. Pt given education materials for cardiazem and SVT.  Payton Emerald

## 2012-01-08 NOTE — Care Management Note (Unsigned)
    Page 1 of 1   01/08/2012     10:24:11 AM   CARE MANAGEMENT NOTE 01/08/2012  Patient:  Helen Stanton, Helen Stanton   Account Number:  [id number]  Date Initiated:  01/08/2012  Documentation initiated by:  SIMMONS,Darien Kading  Subjective/Objective Assessment:   ADMITTED WITH SVT; LIVES AT HOME WITH HER DAUGHTER AND 2 GRANDKIDS; WAS IPTA; STILL WORKS; USES MAIL ORDER CO FOR RX.     Action/Plan:   DISCHARGE PLANNING DISCUSSED AT BEDSIDE.   Anticipated DC Date:  01/09/2012   Anticipated DC Plan:  [place]  CM consult      Choice offered to / List presented to:             Status of service:  In process, will continue to follow Medicare Important Message given?   (If response is "NO", the following Medicare IM given date fields will be blank) Date Medicare IM given:   Date Additional Medicare IM given:    Discharge Disposition:    Per UR Regulation:  Reviewed for med. necessity/level of care/duration of stay  If discussed at [place] of Stay Meetings, dates discussed:    Comments:  01/08/12  [place], BSN [phone] NCM [place].

## 2012-01-16 ENCOUNTER — Emergency Department (HOSPITAL_COMMUNITY)
Admission: EM | Admit: 2012-01-16 | Discharge: 2012-01-16 | Disposition: A | Discharge status: Home / Self Care | Payer: BC Managed Care – PPO | Attending: Emergency Medicine | Admitting: Emergency Medicine

## 2012-01-16 ENCOUNTER — Encounter (HOSPITAL_COMMUNITY): Payer: Self-pay | Admitting: *Deleted

## 2012-01-16 DIAGNOSIS — R21 Rash and other nonspecific skin eruption: Secondary | ICD-10-CM | POA: Insufficient documentation

## 2012-01-16 DIAGNOSIS — I10 Essential (primary) hypertension: Secondary | ICD-10-CM | POA: Insufficient documentation

## 2012-01-16 DIAGNOSIS — L270 Generalized skin eruption due to drugs and medicaments taken internally: Secondary | ICD-10-CM

## 2012-01-16 DIAGNOSIS — Z833 Family history of diabetes mellitus: Secondary | ICD-10-CM | POA: Insufficient documentation

## 2012-01-16 DIAGNOSIS — Z8249 Family history of ischemic heart disease and other diseases of the circulatory system: Secondary | ICD-10-CM | POA: Insufficient documentation

## 2012-01-16 DIAGNOSIS — J45909 Unspecified asthma, uncomplicated: Secondary | ICD-10-CM | POA: Insufficient documentation

## 2012-01-16 MED ORDER — DIPHENHYDRAMINE HCL 25 MG PO CAPS
25.0000 mg | ORAL_CAPSULE | Freq: Once | ORAL | Status: AC
  Administered 2012-01-16: 25 mg via ORAL
  Filled 2012-01-16: qty 1

## 2012-01-16 MED ORDER — FAMOTIDINE 40 MG PO TABS: 40.0000 mg | ORAL_TABLET | Freq: Every day | ORAL | Status: DC

## 2012-01-16 MED ORDER — FAMOTIDINE 20 MG PO TABS
20.0000 mg | ORAL_TABLET | Freq: Once | ORAL | Status: AC
  Administered 2012-01-16: 20 mg via ORAL
  Filled 2012-01-16: qty 1

## 2012-01-16 MED ORDER — PREDNISONE 20 MG PO TABS
60.0000 mg | ORAL_TABLET | Freq: Once | ORAL | Status: AC
  Administered 2012-01-16: 60 mg via ORAL
  Filled 2012-01-16: qty 3

## 2012-01-16 MED ORDER — PREDNISONE 10 MG PO TABS: 20.0000 mg | ORAL_TABLET | Freq: Every day | ORAL | Status: DC

## 2012-01-16 MED ORDER — DIPHENHYDRAMINE HCL 25 MG PO CAPS: 25.0000 mg | ORAL_CAPSULE | Freq: Four times a day (QID) | ORAL | Status: DC | PRN

## 2012-01-16 MED ORDER — METOPROLOL TARTRATE 50 MG PO TABS: 25.0000 mg | ORAL_TABLET | Freq: Two times a day (BID) | ORAL | Status: DC

## 2012-01-16 NOTE — ED Provider Notes (Signed)
Patient developed pruritic rash on trunk and extremities yesterday. Patient started on diltiazem one week ago for SVT. On exam she is alert nontoxic pinkish hive like rash on trunk and extremities. No mucosal involvement. Medical decision-making possible drug reaction. Plan stop diltiazem prescription written from the metoprolol instructed by me to followup with followup with Dr.Polite one week  Orlie Dakin, MD 01/16/12 [id number]

## 2012-01-16 NOTE — ED Provider Notes (Addendum)
History     CSN: [id number]  Arrival date & time 01/16/12  0152   First MD Initiated Contact with Patient 01/16/12 [id number]      Chief Complaint  Patient presents with  . Rash    (Consider location/radiation/quality/duration/timing/severity/associated sxs/prior treatment) HPI  66 year old female with history of hypertension, history of asthma, and a recent history of SVT presents complaining of a rash. Patient states she was diagnosed with SVT 5 days ago and has been taking diltiazem. Today while eating from a followup appointment at her doctor's office, patient begins to develop a rash. She does rash to both arms, to the chest, and her back. Described rashes and itching sensation that is nontender. No aggravating or alleviating factor. She denies headache, neck pain, chest pain, shortness of breath, nausea, vomiting, diarrhea, abdominal pain. Patient reports no other changes in her daily activities. Denies new detergent, new pets, or recent travel.  No history of diabetes. Has not tried anything to alleviate symptoms  Past Medical History  Diagnosis Date  . Hypertension   . Asthma   . HTN (hypertension) 01/08/2012    Past Surgical History  Procedure Date  . Tubal ligation circa 1975    Family History  Problem Relation Age of Onset  . Hypertension Mother   . Dementia Mother   . Hypertension Father   . Dementia Father   . Diabetes Father     History  Substance Use Topics  . Smoking status: Never Smoker   . Smokeless tobacco: Not on file  . Alcohol Use: No    OB History    Grav Para Term Preterm Abortions TAB SAB Ect Mult Living                  Review of Systems  All other systems reviewed and are negative.    Allergies  Fish allergy and Milk-related compounds  Home Medications   Current Outpatient Rx  Name Route Sig Dispense Refill  . ALBUTEROL SULFATE HFA 108 (90 BASE) MCG/ACT IN AERS Inhalation Inhale 2 puffs into the lungs every 6 (six) hours as needed.  For wheezing    . ASPIRIN EC 81 MG PO TBEC Oral Take 81 mg by mouth daily.    Marland Kitchen CALCIUM CARBONATE 1250 MG PO TABS Oral Take 1 tablet by mouth 3 (three) times daily.    Marland Kitchen VITAMIN D 1000 UNITS PO TABS Oral Take 1,000 Units by mouth daily.    Marland Kitchen DILTIAZEM HCL ER COATED BEADS 120 MG PO CP24 Oral Take 1 capsule (120 mg total) by mouth daily. 31 capsule 0  . FLUTICASONE-SALMETEROL 250-50 MCG/DOSE IN AEPB Inhalation Inhale 1 puff into the lungs every 12 (twelve) hours.      BP 174/75  Pulse 73  Temp 97.9 F (36.6 C)  Resp 16  SpO2 99%  Physical Exam  Nursing note and vitals reviewed. Constitutional: She appears well-developed and well-nourished. No distress.       Awake, alert, nontoxic appearance  HENT:  Head: Atraumatic.  Mouth/Throat: Oropharynx is clear and moist. No oropharyngeal exudate.  Eyes: Conjunctivae are normal. Right eye exhibits no discharge. Left eye exhibits no discharge.  Neck: Neck supple.  Cardiovascular: Normal rate and regular rhythm.  Exam reveals no gallop and no friction rub.   No murmur heard. Pulmonary/Chest: Effort normal. No respiratory distress. She exhibits no tenderness.  Abdominal: Soft. There is no tenderness. There is no rebound.  Musculoskeletal: She exhibits no tenderness.       ROM  appears intact, no obvious focal weakness  Neurological: She is alert.       Mental status and motor strength appears intact  Skin: Rash noted.       Maculopapular rash noted to bilateral upper extremities, upper chest, and back. Rash is mildly erythematous. Blanchable. Non-petechial, pustular, or vesicular  Psychiatric: She has a normal mood and affect.    ED Course  Procedures (including critical care time)  Labs Reviewed - No data to display No results found.   No diagnosis found.    MDM  Rash suggestive of drug allergic reaction.  No airway involvement, no fever.  VSS.  Heart rate controlled.  Prednisone/benadryl/pepcid given.  Plan to have pt d/c  diltiazem and f/u with her PCP tomorrow for further care.  Pt voice understanding and agrees with plan.       Medical screening examination/treatment/procedure(s) were conducted as a shared visit with non-physician practitioner(s) and myself.  I personally evaluated the patient during the encounter   Domenic Moras, PA-C 01/16/12 [place], MD 01/16/12 [phone]

## 2012-01-16 NOTE — ED Notes (Signed)
Pt reports noticing a rash on her arms at approx 1900 yesterday evening. "Stated I was it when I took a shower after work". Rash is on the forearms bilaterally, scattered across the pt's back, along her waste line (more on the left, than right), and under both breast.  Rash is "itchy, but not painful". Pt. Has seen her last Tues for "abnormal heart beat" and prescribed Cardizem. She has been taking it as prescribed since last Tuesday.

## 2012-01-16 NOTE — ED Notes (Signed)
The pt has had a rash since this am.  She thinks it is a reaction to amed she was given while she was in the hospital  This past week.  Itching..  She thinks she is allergic to diltizem po

## 2012-08-18 ENCOUNTER — Other Ambulatory Visit: Payer: Self-pay | Admitting: Internal Medicine

## 2012-08-18 DIAGNOSIS — Z1231 Encounter for screening mammogram for malignant neoplasm of breast: Secondary | ICD-10-CM

## 2012-08-27 ENCOUNTER — Ambulatory Visit
Admission: RE | Admit: 2012-08-27 | Discharge: 2012-08-27 | Disposition: A | Discharge status: Home / Self Care | Payer: BC Managed Care – PPO | Source: Ambulatory Visit | Attending: Internal Medicine | Admitting: Internal Medicine

## 2012-08-27 DIAGNOSIS — Z1231 Encounter for screening mammogram for malignant neoplasm of breast: Secondary | ICD-10-CM

## 2013-06-03 ENCOUNTER — Encounter: Payer: Self-pay | Admitting: Cardiology

## 2013-06-04 ENCOUNTER — Encounter (INDEPENDENT_AMBULATORY_CARE_PROVIDER_SITE_OTHER): Payer: Self-pay

## 2013-06-04 ENCOUNTER — Ambulatory Visit (INDEPENDENT_AMBULATORY_CARE_PROVIDER_SITE_OTHER): Payer: BC Managed Care – PPO | Admitting: Cardiology

## 2013-06-04 ENCOUNTER — Encounter: Payer: Self-pay | Admitting: Cardiology

## 2013-06-04 VITALS — BP 130/84 | HR 75 | Ht 61.0 in | Wt 162.0 lb

## 2013-06-04 DIAGNOSIS — I10 Essential (primary) hypertension: Secondary | ICD-10-CM

## 2013-06-04 DIAGNOSIS — Z79899 Other long term (current) drug therapy: Secondary | ICD-10-CM

## 2013-06-04 DIAGNOSIS — I471 Supraventricular tachycardia: Secondary | ICD-10-CM

## 2013-06-04 DIAGNOSIS — I498 Other specified cardiac arrhythmias: Secondary | ICD-10-CM

## 2013-06-04 HISTORY — DX: Other long term (current) drug therapy: Z79.899

## 2013-06-04 MED ORDER — LISINOPRIL-HYDROCHLOROTHIAZIDE 20-25 MG PO TABS: 1.0000 | ORAL_TABLET | Freq: Every day | ORAL | Status: DC

## 2013-06-04 NOTE — Progress Notes (Signed)
[facility]. [address] [place], [place]  [address] Phone: [phone] Fax:  [phone]  Date:  06/04/2013   ID:  Ceriah Stanton, DOB Sep 06, 1945, MRN [id number]  PCP:  Kandice Hams, MD   History of Present Illness: Helen Stanton is a 67 y.o. female followed by Dr Felipa Eth with a hx of hypertension and asthma, who was admitted 01/07/12 for a sudden episode of SVT. She received Adenocard and was then placed on diltiazem but developed rash on diltiazem and this was stopped. No Bb due to asthma.   Outpatient 2-D echocardiogram did not reveal any significant problems. After 5 days on the Diltiazem she developed a rash and was seen 01/16/12 in ED with Diltiazem stopped and gfven bendaryl, prednisone and Pepcid.   She has not had any palpitations nor feelings of racing heart beat. No chest pain, dizziness, syncope, nor PND. NO GI complaints, no fever nor chills    Wt Readings from Last 3 Encounters:  06/04/13 162 lb (73.483 kg)  01/08/12 158 lb 1.1 oz (71.7 kg)     Past Medical History  Diagnosis Date  . Hypertension   . Asthma   . HTN (hypertension) 01/08/2012    Past Surgical History  Procedure Laterality Date  . Tubal ligation  circa 1975    Current Outpatient Prescriptions  Medication Sig Dispense Refill  . albuterol (PROVENTIL HFA;VENTOLIN HFA) 108 (90 BASE) MCG/ACT inhaler Inhale 2 puffs into the lungs every 6 (six) hours as needed. For wheezing      . aspirin EC 81 MG tablet Take 81 mg by mouth daily.      . calcium carbonate (OS-CAL - DOSED IN MG OF ELEMENTAL CALCIUM) 1250 MG tablet Take 1 tablet by mouth 3 (three) times daily.      . cholecalciferol (VITAMIN D) 1000 UNITS tablet Take 1,000 Units by mouth daily.      . Multiple Vitamin (MULTIVITAMIN WITH MINERALS) TABS Take 1 tablet by mouth daily.      . Fluticasone-Salmeterol (ADVAIR) 250-50 MCG/DOSE AEPB Inhale 1 puff into the lungs every 12 (twelve) hours.      Marland Kitchen lisinopril-hydrochlorothiazide  (PRINZIDE,ZESTORETIC) 20-12.5 MG per tablet TAKE TWO TABLET DAILY       No current facility-administered medications for this visit.    Allergies:    Allergies  Allergen Reactions  . Fish Allergy Shortness Of Breath  . Milk-Related Compounds Shortness Of Breath    Social History:  The patient  reports that she has never smoked. She does not have any smokeless tobacco history on file. She reports that she does not drink alcohol or use illicit drugs.   ROS:  Please see the history of present illness.   No syncope, no bleeding, no orthopnea, no PND  PHYSICAL EXAM: VS:  BP 130/84  Pulse 75  Ht 5\' 1"  (1.549 m)  Wt 162 lb (73.483 kg)  BMI 30.63 kg/m2  SpO2 93% Well nourished, well developed, in no acute distress HEENT: normal Neck: no JVD Cardiac:  normal S1, S2; RRR; no murmur Lungs:  clear to auscultation bilaterally, no wheezing, rhonchi or rales Abd: soft, nontender, no hepatomegaly Ext: no edema Skin: warm and dry Neuro: no focal abnormalities noted  EKG:  NSR, PRWP, no ST changes.   Echocardiogram 12/27/11-Normal Ejection Fraction  ASSESSMENT AND PLAN:  1. Paroxysmal supraventricular tachycardia-currently on no medications. With her beta blocker and asthma, not taking. She's not had any further symptoms. If further symptoms occur, EP. Overall  doing well. She had rash on diltiazem. 2. Hypertension-consolidating her lisinopril/HCT to 40/25. Same dosage but just once a day instead of splitting it in half twice a day 3. Discussed with her medications that she can take for cold symptoms. Coricidin is a good alternative. Review for other medications. Prior allergy to calcium channel blocker.  Signed, Candee Furbish, MD [facility]  06/04/2013 9:50 AM

## 2013-06-04 NOTE — Patient Instructions (Addendum)
Coricidin is a good medication for cold symptoms. Avoid Sudafed, other decongestants.  Your physician has recommended you make the following change in your medication:   1. Change Lisinopril-HCTZ to 20-25mg  once daily  Your physician wants you to follow-up in: 6 months with Dr. Marlou Porch. You will receive a reminder letter in the mail two months in advance. If you don't receive a letter, please call our office to schedule the follow-up appointment.

## 2013-07-21 ENCOUNTER — Other Ambulatory Visit: Payer: Self-pay

## 2013-07-21 DIAGNOSIS — Z1231 Encounter for screening mammogram for malignant neoplasm of breast: Secondary | ICD-10-CM

## 2013-08-28 ENCOUNTER — Ambulatory Visit
Admission: RE | Admit: 2013-08-28 | Discharge: 2013-08-28 | Disposition: A | Discharge status: Home / Self Care | Payer: BC Managed Care – PPO | Source: Ambulatory Visit

## 2013-08-28 DIAGNOSIS — Z1231 Encounter for screening mammogram for malignant neoplasm of breast: Secondary | ICD-10-CM

## 2014-01-06 ENCOUNTER — Encounter: Payer: Self-pay | Admitting: Cardiology

## 2015-02-21 DIAGNOSIS — J301 Allergic rhinitis due to pollen: Secondary | ICD-10-CM | POA: Diagnosis not present

## 2015-02-21 DIAGNOSIS — J343 Hypertrophy of nasal turbinates: Secondary | ICD-10-CM | POA: Diagnosis not present

## 2015-02-21 DIAGNOSIS — J330 Polyp of nasal cavity: Secondary | ICD-10-CM | POA: Diagnosis not present

## 2015-02-21 DIAGNOSIS — J331 Polypoid sinus degeneration: Secondary | ICD-10-CM | POA: Diagnosis not present

## 2015-02-21 DIAGNOSIS — J329 Chronic sinusitis, unspecified: Secondary | ICD-10-CM | POA: Diagnosis not present

## 2015-03-10 ENCOUNTER — Other Ambulatory Visit: Payer: Self-pay | Admitting: Otolaryngology

## 2015-03-10 DIAGNOSIS — J322 Chronic ethmoidal sinusitis: Secondary | ICD-10-CM | POA: Diagnosis not present

## 2015-03-10 DIAGNOSIS — J329 Chronic sinusitis, unspecified: Secondary | ICD-10-CM | POA: Diagnosis not present

## 2015-03-10 DIAGNOSIS — J331 Polypoid sinus degeneration: Secondary | ICD-10-CM | POA: Diagnosis not present

## 2015-03-10 DIAGNOSIS — J321 Chronic frontal sinusitis: Secondary | ICD-10-CM | POA: Diagnosis not present

## 2015-03-10 DIAGNOSIS — J320 Chronic maxillary sinusitis: Secondary | ICD-10-CM | POA: Diagnosis not present

## 2015-03-10 DIAGNOSIS — J338 Other polyp of sinus: Secondary | ICD-10-CM | POA: Diagnosis not present

## 2015-03-10 DIAGNOSIS — J343 Hypertrophy of nasal turbinates: Secondary | ICD-10-CM | POA: Diagnosis not present

## 2015-04-04 ENCOUNTER — Other Ambulatory Visit: Payer: Self-pay | Admitting: Internal Medicine

## 2015-04-04 DIAGNOSIS — Z1231 Encounter for screening mammogram for malignant neoplasm of breast: Secondary | ICD-10-CM

## 2015-04-08 ENCOUNTER — Ambulatory Visit
Admission: RE | Admit: 2015-04-08 | Discharge: 2015-04-08 | Disposition: A | Discharge status: Home / Self Care | Payer: BLUE CROSS/BLUE SHIELD | Source: Ambulatory Visit | Attending: Internal Medicine | Admitting: Internal Medicine

## 2015-04-08 DIAGNOSIS — Z1231 Encounter for screening mammogram for malignant neoplasm of breast: Secondary | ICD-10-CM

## 2015-06-24 DIAGNOSIS — H1045 Other chronic allergic conjunctivitis: Secondary | ICD-10-CM | POA: Diagnosis not present

## 2015-06-24 DIAGNOSIS — Z91013 Allergy to seafood: Secondary | ICD-10-CM | POA: Diagnosis not present

## 2015-06-24 DIAGNOSIS — J301 Allergic rhinitis due to pollen: Secondary | ICD-10-CM | POA: Diagnosis not present

## 2015-06-24 DIAGNOSIS — J3089 Other allergic rhinitis: Secondary | ICD-10-CM | POA: Diagnosis not present

## 2015-06-24 DIAGNOSIS — J4530 Mild persistent asthma, uncomplicated: Secondary | ICD-10-CM | POA: Diagnosis not present

## 2015-09-05 DIAGNOSIS — L989 Disorder of the skin and subcutaneous tissue, unspecified: Secondary | ICD-10-CM | POA: Diagnosis not present

## 2015-09-05 DIAGNOSIS — M7138 Other bursal cyst, other site: Secondary | ICD-10-CM | POA: Diagnosis not present

## 2015-10-03 DIAGNOSIS — J45901 Unspecified asthma with (acute) exacerbation: Secondary | ICD-10-CM | POA: Diagnosis not present

## 2015-10-03 DIAGNOSIS — R69 Illness, unspecified: Secondary | ICD-10-CM | POA: Diagnosis not present

## 2016-03-14 ENCOUNTER — Other Ambulatory Visit: Payer: Self-pay | Admitting: Internal Medicine

## 2016-03-14 DIAGNOSIS — Z1231 Encounter for screening mammogram for malignant neoplasm of breast: Secondary | ICD-10-CM

## 2016-04-09 ENCOUNTER — Ambulatory Visit: Payer: BLUE CROSS/BLUE SHIELD

## 2016-05-08 ENCOUNTER — Ambulatory Visit
Admission: RE | Admit: 2016-05-08 | Discharge: 2016-05-08 | Disposition: A | Discharge status: Home / Self Care | Payer: BLUE CROSS/BLUE SHIELD | Source: Ambulatory Visit | Attending: Internal Medicine | Admitting: Internal Medicine

## 2016-05-08 DIAGNOSIS — Z1231 Encounter for screening mammogram for malignant neoplasm of breast: Secondary | ICD-10-CM

## 2017-05-27 DIAGNOSIS — J449 Chronic obstructive pulmonary disease, unspecified: Secondary | ICD-10-CM | POA: Diagnosis not present

## 2017-05-27 DIAGNOSIS — H269 Unspecified cataract: Secondary | ICD-10-CM | POA: Diagnosis not present

## 2017-05-27 DIAGNOSIS — I10 Essential (primary) hypertension: Secondary | ICD-10-CM | POA: Diagnosis not present

## 2017-05-27 DIAGNOSIS — E669 Obesity, unspecified: Secondary | ICD-10-CM | POA: Diagnosis not present

## 2017-05-27 DIAGNOSIS — Z6830 Body mass index (BMI) 30.0-30.9, adult: Secondary | ICD-10-CM | POA: Diagnosis not present

## 2017-06-25 ENCOUNTER — Other Ambulatory Visit: Payer: Self-pay | Admitting: Internal Medicine

## 2017-06-25 DIAGNOSIS — Z1231 Encounter for screening mammogram for malignant neoplasm of breast: Secondary | ICD-10-CM

## 2017-07-24 ENCOUNTER — Ambulatory Visit
Admission: RE | Admit: 2017-07-24 | Discharge: 2017-07-24 | Disposition: A | Discharge status: Home / Self Care | Payer: Medicare PPO | Source: Ambulatory Visit | Attending: Internal Medicine | Admitting: Internal Medicine

## 2017-07-24 DIAGNOSIS — Z1231 Encounter for screening mammogram for malignant neoplasm of breast: Secondary | ICD-10-CM

## 2017-10-29 DIAGNOSIS — J4530 Mild persistent asthma, uncomplicated: Secondary | ICD-10-CM | POA: Diagnosis not present

## 2017-10-29 DIAGNOSIS — J3089 Other allergic rhinitis: Secondary | ICD-10-CM | POA: Diagnosis not present

## 2017-10-29 DIAGNOSIS — Z91013 Allergy to seafood: Secondary | ICD-10-CM | POA: Diagnosis not present

## 2017-10-29 DIAGNOSIS — J301 Allergic rhinitis due to pollen: Secondary | ICD-10-CM | POA: Diagnosis not present

## 2017-10-29 DIAGNOSIS — H1045 Other chronic allergic conjunctivitis: Secondary | ICD-10-CM | POA: Diagnosis not present

## 2018-05-08 ENCOUNTER — Other Ambulatory Visit: Payer: Self-pay | Admitting: Internal Medicine

## 2018-05-08 DIAGNOSIS — M8588 Other specified disorders of bone density and structure, other site: Secondary | ICD-10-CM

## 2018-06-02 ENCOUNTER — Ambulatory Visit
Admission: RE | Admit: 2018-06-02 | Discharge: 2018-06-02 | Disposition: A | Discharge status: Home / Self Care | Payer: Managed Care, Other (non HMO) | Source: Ambulatory Visit | Attending: Internal Medicine | Admitting: Internal Medicine

## 2018-06-02 DIAGNOSIS — M8588 Other specified disorders of bone density and structure, other site: Secondary | ICD-10-CM

## 2018-07-25 ENCOUNTER — Other Ambulatory Visit: Payer: Self-pay | Admitting: Internal Medicine

## 2018-07-25 DIAGNOSIS — Z1231 Encounter for screening mammogram for malignant neoplasm of breast: Secondary | ICD-10-CM

## 2018-08-28 ENCOUNTER — Ambulatory Visit
Admission: RE | Admit: 2018-08-28 | Discharge: 2018-08-28 | Disposition: A | Discharge status: Home / Self Care | Payer: Managed Care, Other (non HMO) | Source: Ambulatory Visit | Attending: Internal Medicine | Admitting: Internal Medicine

## 2018-08-28 DIAGNOSIS — Z1231 Encounter for screening mammogram for malignant neoplasm of breast: Secondary | ICD-10-CM

## 2018-12-25 DIAGNOSIS — H2511 Age-related nuclear cataract, right eye: Secondary | ICD-10-CM | POA: Diagnosis not present

## 2019-02-19 DIAGNOSIS — H2512 Age-related nuclear cataract, left eye: Secondary | ICD-10-CM | POA: Diagnosis not present

## 2019-02-19 DIAGNOSIS — H25812 Combined forms of age-related cataract, left eye: Secondary | ICD-10-CM | POA: Diagnosis not present

## 2019-03-03 DIAGNOSIS — H0014 Chalazion left upper eyelid: Secondary | ICD-10-CM | POA: Diagnosis not present

## 2019-06-15 DIAGNOSIS — J3089 Other allergic rhinitis: Secondary | ICD-10-CM | POA: Diagnosis not present

## 2019-06-15 DIAGNOSIS — H1045 Other chronic allergic conjunctivitis: Secondary | ICD-10-CM | POA: Diagnosis not present

## 2019-06-15 DIAGNOSIS — J301 Allergic rhinitis due to pollen: Secondary | ICD-10-CM | POA: Diagnosis not present

## 2019-06-15 DIAGNOSIS — Z91013 Allergy to seafood: Secondary | ICD-10-CM | POA: Diagnosis not present

## 2019-06-15 DIAGNOSIS — J4530 Mild persistent asthma, uncomplicated: Secondary | ICD-10-CM | POA: Diagnosis not present

## 2019-07-21 DIAGNOSIS — J3089 Other allergic rhinitis: Secondary | ICD-10-CM | POA: Diagnosis not present

## 2019-07-21 DIAGNOSIS — J301 Allergic rhinitis due to pollen: Secondary | ICD-10-CM | POA: Diagnosis not present

## 2019-08-05 DIAGNOSIS — J301 Allergic rhinitis due to pollen: Secondary | ICD-10-CM | POA: Diagnosis not present

## 2019-08-05 DIAGNOSIS — J3089 Other allergic rhinitis: Secondary | ICD-10-CM | POA: Diagnosis not present

## 2019-08-25 DIAGNOSIS — Z20828 Contact with and (suspected) exposure to other viral communicable diseases: Secondary | ICD-10-CM | POA: Diagnosis not present

## 2019-09-16 DIAGNOSIS — J3089 Other allergic rhinitis: Secondary | ICD-10-CM | POA: Diagnosis not present

## 2019-09-16 DIAGNOSIS — J301 Allergic rhinitis due to pollen: Secondary | ICD-10-CM | POA: Diagnosis not present

## 2020-05-31 DIAGNOSIS — E7800 Pure hypercholesterolemia, unspecified: Secondary | ICD-10-CM | POA: Diagnosis not present

## 2020-05-31 DIAGNOSIS — Z0000 Encounter for general adult medical examination without abnormal findings: Secondary | ICD-10-CM | POA: Diagnosis not present

## 2020-05-31 DIAGNOSIS — Z8679 Personal history of other diseases of the circulatory system: Secondary | ICD-10-CM | POA: Diagnosis not present

## 2020-05-31 DIAGNOSIS — Z23 Encounter for immunization: Secondary | ICD-10-CM | POA: Diagnosis not present

## 2020-05-31 DIAGNOSIS — M8588 Other specified disorders of bone density and structure, other site: Secondary | ICD-10-CM | POA: Diagnosis not present

## 2020-05-31 DIAGNOSIS — I10 Essential (primary) hypertension: Secondary | ICD-10-CM | POA: Diagnosis not present

## 2020-06-14 DIAGNOSIS — I10 Essential (primary) hypertension: Secondary | ICD-10-CM | POA: Diagnosis not present

## 2020-06-28 ENCOUNTER — Other Ambulatory Visit: Payer: Self-pay | Admitting: Internal Medicine

## 2020-06-28 DIAGNOSIS — M8580 Other specified disorders of bone density and structure, unspecified site: Secondary | ICD-10-CM

## 2020-07-18 DIAGNOSIS — Z20822 Contact with and (suspected) exposure to covid-19: Secondary | ICD-10-CM | POA: Diagnosis not present

## 2020-10-13 ENCOUNTER — Ambulatory Visit
Admission: RE | Admit: 2020-10-13 | Discharge: 2020-10-13 | Disposition: A | Discharge status: Home / Self Care | Payer: Medicare HMO | Source: Ambulatory Visit | Attending: Internal Medicine | Admitting: Internal Medicine

## 2020-10-13 ENCOUNTER — Other Ambulatory Visit: Payer: Self-pay

## 2020-10-13 DIAGNOSIS — Z780 Asymptomatic menopausal state: Secondary | ICD-10-CM | POA: Diagnosis not present

## 2020-10-13 DIAGNOSIS — M8580 Other specified disorders of bone density and structure, unspecified site: Secondary | ICD-10-CM

## 2020-10-13 DIAGNOSIS — M8589 Other specified disorders of bone density and structure, multiple sites: Secondary | ICD-10-CM | POA: Diagnosis not present

## 2021-02-13 ENCOUNTER — Inpatient Hospital Stay (HOSPITAL_COMMUNITY)
Admission: EM | Admit: 2021-02-13 | Discharge: 2021-02-17 | DRG: 175 | Disposition: A | Discharge status: Home / Self Care | Payer: BC Managed Care – PPO | Attending: Family Medicine | Admitting: Family Medicine

## 2021-02-13 ENCOUNTER — Emergency Department (HOSPITAL_COMMUNITY): Payer: BC Managed Care – PPO

## 2021-02-13 DIAGNOSIS — K219 Gastro-esophageal reflux disease without esophagitis: Secondary | ICD-10-CM | POA: Diagnosis present

## 2021-02-13 DIAGNOSIS — Z20822 Contact with and (suspected) exposure to covid-19: Secondary | ICD-10-CM | POA: Diagnosis present

## 2021-02-13 DIAGNOSIS — Z79899 Other long term (current) drug therapy: Secondary | ICD-10-CM | POA: Diagnosis not present

## 2021-02-13 DIAGNOSIS — I480 Paroxysmal atrial fibrillation: Secondary | ICD-10-CM | POA: Diagnosis not present

## 2021-02-13 DIAGNOSIS — Z833 Family history of diabetes mellitus: Secondary | ICD-10-CM

## 2021-02-13 DIAGNOSIS — R911 Solitary pulmonary nodule: Secondary | ICD-10-CM | POA: Diagnosis present

## 2021-02-13 DIAGNOSIS — Z6835 Body mass index (BMI) 35.0-35.9, adult: Secondary | ICD-10-CM

## 2021-02-13 DIAGNOSIS — J45909 Unspecified asthma, uncomplicated: Secondary | ICD-10-CM | POA: Diagnosis present

## 2021-02-13 DIAGNOSIS — I2694 Multiple subsegmental pulmonary emboli without acute cor pulmonale: Secondary | ICD-10-CM | POA: Diagnosis not present

## 2021-02-13 DIAGNOSIS — J90 Pleural effusion, not elsewhere classified: Secondary | ICD-10-CM | POA: Diagnosis not present

## 2021-02-13 DIAGNOSIS — I2609 Other pulmonary embolism with acute cor pulmonale: Secondary | ICD-10-CM | POA: Diagnosis not present

## 2021-02-13 DIAGNOSIS — Z91011 Allergy to milk products: Secondary | ICD-10-CM | POA: Diagnosis not present

## 2021-02-13 DIAGNOSIS — Z7982 Long term (current) use of aspirin: Secondary | ICD-10-CM

## 2021-02-13 DIAGNOSIS — Z91013 Allergy to seafood: Secondary | ICD-10-CM

## 2021-02-13 DIAGNOSIS — I10 Essential (primary) hypertension: Secondary | ICD-10-CM | POA: Diagnosis present

## 2021-02-13 DIAGNOSIS — Z8249 Family history of ischemic heart disease and other diseases of the circulatory system: Secondary | ICD-10-CM | POA: Diagnosis not present

## 2021-02-13 DIAGNOSIS — I2699 Other pulmonary embolism without acute cor pulmonale: Secondary | ICD-10-CM | POA: Diagnosis present

## 2021-02-13 DIAGNOSIS — I4891 Unspecified atrial fibrillation: Secondary | ICD-10-CM | POA: Diagnosis not present

## 2021-02-13 DIAGNOSIS — R1011 Right upper quadrant pain: Secondary | ICD-10-CM | POA: Diagnosis not present

## 2021-02-13 DIAGNOSIS — K449 Diaphragmatic hernia without obstruction or gangrene: Secondary | ICD-10-CM | POA: Diagnosis not present

## 2021-02-13 DIAGNOSIS — R0602 Shortness of breath: Secondary | ICD-10-CM | POA: Diagnosis not present

## 2021-02-13 DIAGNOSIS — Z7951 Long term (current) use of inhaled steroids: Secondary | ICD-10-CM

## 2021-02-13 DIAGNOSIS — I159 Secondary hypertension, unspecified: Secondary | ICD-10-CM | POA: Diagnosis not present

## 2021-02-13 DIAGNOSIS — R079 Chest pain, unspecified: Secondary | ICD-10-CM | POA: Diagnosis not present

## 2021-02-13 MED ORDER — ALBUTEROL SULFATE HFA 108 (90 BASE) MCG/ACT IN AERS
INHALATION_SPRAY | RESPIRATORY_TRACT | Status: AC
  Administered 2021-02-13: 6 via RESPIRATORY_TRACT
  Filled 2021-02-13: qty 6.7

## 2021-02-13 MED ORDER — AEROCHAMBER PLUS FLO-VU LARGE MISC: 1.0000 | Freq: Once | Status: DC

## 2021-02-13 MED ORDER — ALBUTEROL SULFATE HFA 108 (90 BASE) MCG/ACT IN AERS
6.0000 | INHALATION_SPRAY | RESPIRATORY_TRACT | Status: DC | PRN
  Filled 2021-02-13: qty 6.7

## 2021-02-13 NOTE — ED Triage Notes (Signed)
Pt c/o RUQ pain, goes up to R shoulder. Worse w movement, began approx 8pm 7/31. Denies N/V, CP, SHOB. States pain is felt in R shoulder when RUQ palpated. Pain worse upon inspiration. Wheezing heard bilaterally, pt states she feels it is related to her asthma.

## 2021-02-13 NOTE — ED Provider Notes (Addendum)
Emergency Medicine Provider Triage Evaluation Note  Itzel Konicki , a 75 y.o. female  was evaluated in triage.  Pt complains of 24 hours of right upper quadrant pain that radiates into her right chest and right shoulder.  Reports pain is dull and achy but becoming severe and sharp with movement or deep inspiration.  Denies nausea, vomiting or diarrhea.  No previous abdominal surgeries.  No previous cardiac history.  Patient does have a history of asthma and reports she does feel some short of breath.  Last treatment for her asthma was yesterday.  No other complaints at this time..  Review of Systems  Positive: Shortness of breath, wheezing, right-sided chest pain, right upper quadrant abdominal pain Negative: Nausea, vomiting, diarrhea  Physical Exam  BP (!) 144/125 (BP Location: Left Arm)   Pulse (!) 146   Temp 100 F (37.8 C)   Resp 18   SpO2 98%  Gen:   Awake, no distress, uncomfortable appearing Resp:  Moderate effort, inspiratory and expiratory wheezes in all fields MSK:   Moves extremities without difficulty, no peripheral edema Other:  Tender to palpation in the right upper quadrant without guarding.  Tachycardic with irregularly irregular rhythm  *EKG changes from prior noted -MD aware when given ECG  Medical Decision Making  Medically screening exam initiated at 10:56 PM.  Appropriate orders placed.  Arlyce Wollard was informed that the remainder of the evaluation will be completed by another provider, this initial triage assessment does not replace that evaluation, and the importance of remaining in the ED until their evaluation is complete.  Right upper quadrant abdominal pain.  Concern for cholecystitis, choledocholithiasis or lung etiology including possibility of pulmonary embolism, pneumonia, asthma exacerbation.    Inspiratory and expiratory wheezing with increased work of breathing - patient was given albuterol MDI in triage.     Barak Bialecki, Gwenlyn Perking 02/13/21  Bartholomew, [place], DO 02/13/21 2316

## 2021-02-14 ENCOUNTER — Emergency Department (HOSPITAL_COMMUNITY): Payer: BC Managed Care – PPO

## 2021-02-14 ENCOUNTER — Other Ambulatory Visit (HOSPITAL_COMMUNITY): Payer: Self-pay

## 2021-02-14 ENCOUNTER — Inpatient Hospital Stay (HOSPITAL_COMMUNITY): Payer: BC Managed Care – PPO

## 2021-02-14 DIAGNOSIS — Z20822 Contact with and (suspected) exposure to covid-19: Secondary | ICD-10-CM | POA: Diagnosis present

## 2021-02-14 DIAGNOSIS — Z833 Family history of diabetes mellitus: Secondary | ICD-10-CM | POA: Diagnosis not present

## 2021-02-14 DIAGNOSIS — Z79899 Other long term (current) drug therapy: Secondary | ICD-10-CM | POA: Diagnosis not present

## 2021-02-14 DIAGNOSIS — R1011 Right upper quadrant pain: Secondary | ICD-10-CM | POA: Diagnosis present

## 2021-02-14 DIAGNOSIS — R911 Solitary pulmonary nodule: Secondary | ICD-10-CM | POA: Diagnosis not present

## 2021-02-14 DIAGNOSIS — I480 Paroxysmal atrial fibrillation: Secondary | ICD-10-CM | POA: Diagnosis present

## 2021-02-14 DIAGNOSIS — K449 Diaphragmatic hernia without obstruction or gangrene: Secondary | ICD-10-CM | POA: Diagnosis not present

## 2021-02-14 DIAGNOSIS — Z91011 Allergy to milk products: Secondary | ICD-10-CM | POA: Diagnosis not present

## 2021-02-14 DIAGNOSIS — Z8249 Family history of ischemic heart disease and other diseases of the circulatory system: Secondary | ICD-10-CM | POA: Diagnosis not present

## 2021-02-14 DIAGNOSIS — J45909 Unspecified asthma, uncomplicated: Secondary | ICD-10-CM | POA: Diagnosis present

## 2021-02-14 DIAGNOSIS — K219 Gastro-esophageal reflux disease without esophagitis: Secondary | ICD-10-CM | POA: Diagnosis present

## 2021-02-14 DIAGNOSIS — I159 Secondary hypertension, unspecified: Secondary | ICD-10-CM

## 2021-02-14 DIAGNOSIS — J90 Pleural effusion, not elsewhere classified: Secondary | ICD-10-CM | POA: Diagnosis not present

## 2021-02-14 DIAGNOSIS — Z91013 Allergy to seafood: Secondary | ICD-10-CM | POA: Diagnosis not present

## 2021-02-14 DIAGNOSIS — I2694 Multiple subsegmental pulmonary emboli without acute cor pulmonale: Secondary | ICD-10-CM | POA: Diagnosis not present

## 2021-02-14 DIAGNOSIS — I2699 Other pulmonary embolism without acute cor pulmonale: Secondary | ICD-10-CM

## 2021-02-14 DIAGNOSIS — I2609 Other pulmonary embolism with acute cor pulmonale: Secondary | ICD-10-CM | POA: Diagnosis present

## 2021-02-14 DIAGNOSIS — I10 Essential (primary) hypertension: Secondary | ICD-10-CM | POA: Diagnosis present

## 2021-02-14 DIAGNOSIS — I4891 Unspecified atrial fibrillation: Secondary | ICD-10-CM

## 2021-02-14 DIAGNOSIS — Z7982 Long term (current) use of aspirin: Secondary | ICD-10-CM | POA: Diagnosis not present

## 2021-02-14 DIAGNOSIS — Z7951 Long term (current) use of inhaled steroids: Secondary | ICD-10-CM | POA: Diagnosis not present

## 2021-02-14 DIAGNOSIS — Z6835 Body mass index (BMI) 35.0-35.9, adult: Secondary | ICD-10-CM | POA: Diagnosis not present

## 2021-02-14 DIAGNOSIS — R079 Chest pain, unspecified: Secondary | ICD-10-CM | POA: Diagnosis not present

## 2021-02-14 HISTORY — DX: Other pulmonary embolism without acute cor pulmonale: I26.99

## 2021-02-14 HISTORY — DX: Unspecified atrial fibrillation: I48.91

## 2021-02-14 LAB — CBC WITH DIFFERENTIAL/PLATELET
Abs Immature Granulocytes: 0.06 10*3/uL (ref 0.00–0.07)
Basophils Absolute: 0 10*3/uL (ref 0.0–0.1)
Basophils Relative: 0 %
Eosinophils Absolute: 0.1 10*3/uL (ref 0.0–0.5)
Eosinophils Relative: 1 %
HCT: 39.7 % (ref 36.0–46.0)
Hemoglobin: 12.5 g/dL (ref 12.0–15.0)
Immature Granulocytes: 0 %
Lymphocytes Relative: 14 %
Lymphs Abs: 1.9 10*3/uL (ref 0.7–4.0)
MCH: 24.7 pg — ABNORMAL LOW (ref 26.0–34.0)
MCHC: 31.5 g/dL (ref 30.0–36.0)
MCV: 78.5 fL — ABNORMAL LOW (ref 80.0–100.0)
Monocytes Absolute: 1.2 10*3/uL — ABNORMAL HIGH (ref 0.1–1.0)
Monocytes Relative: 9 %
Neutro Abs: 10.1 10*3/uL — ABNORMAL HIGH (ref 1.7–7.7)
Neutrophils Relative %: 76 %
Platelets: 226 10*3/uL (ref 150–400)
RBC: 5.06 MIL/uL (ref 3.87–5.11)
RDW: 14.2 % (ref 11.5–15.5)
WBC: 13.4 10*3/uL — ABNORMAL HIGH (ref 4.0–10.5)
nRBC: 0 % (ref 0.0–0.2)

## 2021-02-14 LAB — RESP PANEL BY RT-PCR (FLU A&B, COVID) ARPGX2
Influenza A by PCR: NEGATIVE
Influenza B by PCR: NEGATIVE
SARS Coronavirus 2 by RT PCR: NEGATIVE

## 2021-02-14 LAB — COMPREHENSIVE METABOLIC PANEL
ALT: 23 U/L (ref 0–44)
AST: 26 U/L (ref 15–41)
Albumin: 3.1 g/dL — ABNORMAL LOW (ref 3.5–5.0)
Alkaline Phosphatase: 75 U/L (ref 38–126)
Anion gap: 10 (ref 5–15)
BUN: 13 mg/dL (ref 8–23)
CO2: 25 mmol/L (ref 22–32)
Calcium: 9.4 mg/dL (ref 8.9–10.3)
Chloride: 99 mmol/L (ref 98–111)
Creatinine, Ser: 1.13 mg/dL — ABNORMAL HIGH (ref 0.44–1.00)
GFR, Estimated: 50 mL/min — ABNORMAL LOW (ref >60)
Glucose, Bld: 136 mg/dL — ABNORMAL HIGH (ref 70–99)
Potassium: 3.8 mmol/L (ref 3.5–5.1)
Sodium: 134 mmol/L — ABNORMAL LOW (ref 135–145)
Total Bilirubin: 1.1 mg/dL (ref 0.3–1.2)
Total Protein: 7.1 g/dL (ref 6.5–8.1)

## 2021-02-14 LAB — HEPARIN LEVEL (UNFRACTIONATED): Heparin Unfractionated: 0.3 IU/mL (ref 0.30–0.70)

## 2021-02-14 LAB — LIPASE, BLOOD: Lipase: 43 U/L (ref 11–51)

## 2021-02-14 LAB — TROPONIN I (HIGH SENSITIVITY)
Troponin I (High Sensitivity): 30 ng/L — ABNORMAL HIGH (ref <18)
Troponin I (High Sensitivity): 33 ng/L — ABNORMAL HIGH (ref <18)

## 2021-02-14 MED ORDER — DILTIAZEM LOAD VIA INFUSION
10.0000 mg | Freq: Once | INTRAVENOUS | Status: AC
  Administered 2021-02-14: 10 mg via INTRAVENOUS
  Filled 2021-02-14: qty 10

## 2021-02-14 MED ORDER — MOMETASONE FURO-FORMOTEROL FUM 200-5 MCG/ACT IN AERO
2.0000 | INHALATION_SPRAY | Freq: Two times a day (BID) | RESPIRATORY_TRACT | Status: DC
  Administered 2021-02-15 – 2021-02-17 (×5): 2 via RESPIRATORY_TRACT
  Filled 2021-02-14: qty 8.8

## 2021-02-14 MED ORDER — DILTIAZEM HCL-DEXTROSE 125-5 MG/125ML-% IV SOLN (PREMIX)
5.0000 mg/h | INTRAVENOUS | Status: DC
  Administered 2021-02-14: 5 mg/h via INTRAVENOUS
  Filled 2021-02-14: qty 125

## 2021-02-14 MED ORDER — LISINOPRIL 10 MG PO TABS
20.0000 mg | ORAL_TABLET | Freq: Every day | ORAL | Status: DC
  Administered 2021-02-14 – 2021-02-15 (×2): 20 mg via ORAL
  Filled 2021-02-14: qty 2
  Filled 2021-02-14: qty 1
  Filled 2021-02-14: qty 2

## 2021-02-14 MED ORDER — ACETAMINOPHEN 650 MG RE SUPP: 650.0000 mg | Freq: Four times a day (QID) | RECTAL | Status: DC | PRN

## 2021-02-14 MED ORDER — HEPARIN BOLUS VIA INFUSION
5000.0000 [IU] | Freq: Once | INTRAVENOUS | Status: AC
  Administered 2021-02-14: 5000 [IU] via INTRAVENOUS
  Filled 2021-02-14: qty 5000

## 2021-02-14 MED ORDER — MONTELUKAST SODIUM 10 MG PO TABS
10.0000 mg | ORAL_TABLET | Freq: Every day | ORAL | Status: DC
  Administered 2021-02-15 – 2021-02-17 (×3): 10 mg via ORAL
  Filled 2021-02-14 (×3): qty 1

## 2021-02-14 MED ORDER — ACETAMINOPHEN 325 MG PO TABS
650.0000 mg | ORAL_TABLET | Freq: Four times a day (QID) | ORAL | Status: DC | PRN
  Administered 2021-02-15 – 2021-02-17 (×4): 650 mg via ORAL
  Filled 2021-02-14 (×4): qty 2

## 2021-02-14 MED ORDER — IOHEXOL 350 MG/ML SOLN
64.0000 mL | Freq: Once | INTRAVENOUS | Status: AC | PRN
  Administered 2021-02-14: 64 mL via INTRAVENOUS

## 2021-02-14 MED ORDER — ASPIRIN EC 81 MG PO TBEC
81.0000 mg | DELAYED_RELEASE_TABLET | Freq: Every day | ORAL | Status: DC
  Administered 2021-02-14 – 2021-02-16 (×3): 81 mg via ORAL
  Filled 2021-02-14 (×3): qty 1

## 2021-02-14 MED ORDER — LISINOPRIL-HYDROCHLOROTHIAZIDE 20-25 MG PO TABS: 1.0000 | ORAL_TABLET | Freq: Every day | ORAL | Status: DC

## 2021-02-14 MED ORDER — ADULT MULTIVITAMIN W/MINERALS CH
1.0000 | ORAL_TABLET | Freq: Every day | ORAL | Status: DC
  Administered 2021-02-14 – 2021-02-17 (×4): 1 via ORAL
  Filled 2021-02-14 (×4): qty 1

## 2021-02-14 MED ORDER — SODIUM CHLORIDE 0.9% FLUSH
3.0000 mL | Freq: Two times a day (BID) | INTRAVENOUS | Status: DC
  Administered 2021-02-16: 3 mL via INTRAVENOUS

## 2021-02-14 MED ORDER — HYDROCHLOROTHIAZIDE 25 MG PO TABS
25.0000 mg | ORAL_TABLET | Freq: Every day | ORAL | Status: DC
  Administered 2021-02-14 – 2021-02-16 (×3): 25 mg via ORAL
  Filled 2021-02-14 (×3): qty 1

## 2021-02-14 MED ORDER — HEPARIN (PORCINE) 25000 UT/250ML-% IV SOLN
1100.0000 [IU]/h | INTRAVENOUS | Status: DC
  Administered 2021-02-14: 900 [IU]/h via INTRAVENOUS
  Administered 2021-02-15: 1000 [IU]/h via INTRAVENOUS
  Filled 2021-02-14 (×3): qty 250

## 2021-02-14 NOTE — Assessment & Plan Note (Addendum)
-   continue lisinopril/hctz

## 2021-02-14 NOTE — Assessment & Plan Note (Signed)
-  continue singulair

## 2021-02-14 NOTE — ED Notes (Signed)
Attempted to call report, was advised RN is busy at the moment and provided a call back number.

## 2021-02-14 NOTE — TOC Benefit Eligibility Note (Signed)
Patient Teacher, English as a foreign language completed.    The patient is currently admitted and upon discharge could be taking Eliquis 5 mg.  The current 30 day co-pay is, $20.00.   The patient is insured through [place] Patient Advocate Specialist [place] Team Direct Number: [phone]  Fax: [phone]

## 2021-02-14 NOTE — Progress Notes (Signed)
ANTICOAGULATION CONSULT NOTE  Pharmacy Consult for Heparin  Indication: pulmonary embolus + Afib  Allergies  Allergen Reactions   Fish Allergy Shortness Of Breath   Milk-Related Compounds Shortness Of Breath     Vital Signs: BP: 152/68 (08/02 1230) Pulse Rate: 77 (08/02 1230)  Labs: Recent Labs    02/13/21 2300 02/14/21 0054 02/14/21 1218  HGB 12.5  --   --   HCT 39.7  --   --   PLT 226  --   --   HEPARINUNFRC  --   --  0.30  CREATININE 1.13*  --   --   TROPONINIHS 30* 33*  --      CrCl cannot be calculated (Unknown ideal weight.).   Medical History: Past Medical History:  Diagnosis Date   Asthma    HTN (hypertension) 01/08/2012   Hypertension      Assessment: 75 y/o F with right upper quadrant pain found to have new onset pulmonary embolus per CT Angio. Also found to be in Afib - now converted to NSR.  Heparin level came back therapeutic at 0.3, on 900 units/hr. Hgb 12.5, plt 226. Trop 30>33. No s/sx of bleeding or infusion issues per nursing.   Goal of Therapy:  Heparin level 0.3-0.7 units/ml Monitor platelets by anticoagulation protocol: Yes   Plan:  Increase heparin drip to 1000 units/hr to keep in goal range  Daily CBC/Heparin level Monitor for bleeding  Antonietta Jewel, PharmD, [place] Pharmacist  Phone: [phone] 02/14/2021 2:09 PM  Please check AMION for all [place] phone numbers After 10:00 PM, call [place] [phone]

## 2021-02-14 NOTE — ED Provider Notes (Signed)
[facility] EMERGENCY DEPARTMENT Provider Note   CSN: [id number] Arrival date & time: 02/13/21  2059     History Chief Complaint  Patient presents with   Abdominal Pain    Helen Stanton is a 75 y.o. female.  The history is provided by the patient and medical records.  Abdominal Pain Helen Stanton is a 75 y.o. female who presents to the Emergency Department complaining of abdominal pain. She presents the emergency department complaining of severe sharp right upper quadrant/right lower chest pain that started on Sunday night. Pain is worse when she takes a deep breath. She denies any associated fevers, cough, nausea, vomiting, diarrhea, leg swelling or pain. She does have mild associated shortness of breath. She has a history of hypertension, asthma. No history of DVT/PE. No history of a fib. No recent surgeries or prolonged travel. No prior similar symptoms.    Past Medical History:  Diagnosis Date   Asthma    HTN (hypertension) 01/08/2012   Hypertension     Patient Active Problem List   Diagnosis Date Noted   Atrial fibrillation with RVR ([place]) 02/14/2021   Medication management 06/04/2013   SVT (supraventricular tachycardia) ([place]) 01/08/2012   HTN (hypertension) 01/08/2012   Asthma 01/08/2012    Past Surgical History:  Procedure Laterality Date   TUBAL LIGATION  circa 1975     OB History   No obstetric history on file.     Family History  Problem Relation Age of Onset   Hypertension Mother    Dementia Mother    Hypertension Father    Dementia Father    Diabetes Father     Social History   Tobacco Use   Smoking status: Never  Substance Use Topics   Alcohol use: No   Drug use: No    Home Medications Prior to Admission medications   Medication Sig Start Date End Date Taking? Authorizing Provider  albuterol (PROVENTIL HFA;VENTOLIN HFA) 108 (90 BASE) MCG/ACT inhaler Inhale 2 puffs into the lungs every 6 (six) hours as needed. For wheezing     [provider]  aspirin EC 81 MG tablet Take 81 mg by mouth daily.    [provider]  calcium carbonate (OS-CAL - DOSED IN MG OF ELEMENTAL CALCIUM) 1250 MG tablet Take 1 tablet by mouth 3 (three) times daily.    [provider]  cholecalciferol (VITAMIN D) 1000 UNITS tablet Take 1,000 Units by mouth daily.    [provider]  Fluticasone-Salmeterol (ADVAIR) 250-50 MCG/DOSE AEPB Inhale 1 puff into the lungs every 12 (twelve) hours.    [provider]  lisinopril-hydrochlorothiazide (PRINZIDE,ZESTORETIC) 20-25 MG per tablet Take 1 tablet by mouth daily. 06/04/13   Jerline Pain, MD  Multiple Vitamin (MULTIVITAMIN WITH MINERALS) TABS Take 1 tablet by mouth daily.    [provider]    Allergies    Fish allergy and Milk-related compounds  Review of Systems   Review of Systems  Gastrointestinal:  Positive for abdominal pain.  All other systems reviewed and are negative.  Physical Exam Updated Vital Signs BP (!) 123/53   Pulse 72   Temp 100 F (37.8 C)   Resp 18   Wt 85.7 kg   SpO2 99%   BMI 35.71 kg/m   Physical Exam Vitals and nursing note reviewed.  Constitutional:      Appearance: She is well-developed.  HENT:     Head: Normocephalic and atraumatic.  Cardiovascular:     Rate and Rhythm:  Tachycardia present. Rhythm irregular.     Heart sounds: No murmur heard. Pulmonary:     Effort: Pulmonary effort is normal. No respiratory distress.     Breath sounds: Normal breath sounds.  Abdominal:     Palpations: Abdomen is soft.     Tenderness: There is no guarding or rebound.     Comments: Minimal RUQ tenderness  Musculoskeletal:        General: No swelling or tenderness.  Skin:    General: Skin is warm and dry.  Neurological:     Mental Status: She is alert and oriented to person, place, and time.  Psychiatric:        Behavior: Behavior normal.    ED Results / Procedures / Treatments   Labs (all labs ordered are  listed, but only abnormal results are displayed) Labs Reviewed  CBC WITH DIFFERENTIAL/PLATELET - Abnormal; Notable for the following components:      Result Value   WBC 13.4 (*)    MCV 78.5 (*)    MCH 24.7 (*)    Neutro Abs 10.1 (*)    Monocytes Absolute 1.2 (*)    All other components within normal limits  COMPREHENSIVE METABOLIC PANEL - Abnormal; Notable for the following components:   Sodium 134 (*)    Glucose, Bld 136 (*)    Creatinine, Ser 1.13 (*)    Albumin 3.1 (*)    GFR, Estimated 50 (*)    All other components within normal limits  TROPONIN I (HIGH SENSITIVITY) - Abnormal; Notable for the following components:   Troponin I (High Sensitivity) 30 (*)    All other components within normal limits  TROPONIN I (HIGH SENSITIVITY) - Abnormal; Notable for the following components:   Troponin I (High Sensitivity) 33 (*)    All other components within normal limits  RESP PANEL BY RT-PCR (FLU A&B, COVID) ARPGX2  LIPASE, BLOOD  HEPARIN LEVEL (UNFRACTIONATED)    EKG EKG Interpretation  Date/Time:  Monday February 13 2021 23:04:10 EDT Ventricular Rate:  157 PR Interval:    QRS Duration: 120 QT Interval:  312 QTC Calculation: 504 R Axis:   16 Text Interpretation: Atrial fibrillation with rapid ventricular response Incomplete left bundle branch block Minimal voltage criteria for LVH, may be normal variant ( Cornell product ) Marked ST abnormality, possible inferolateral subendocardial injury Abnormal ECG Confirmed by Quintella Reichert [phone]) on 02/14/2021 12:33:33 AM  Radiology DG Chest 2 View  Result Date: 02/13/2021 CLINICAL DATA:  Right upper quadrant abdominal pain, short of breath, chest pain EXAM: CHEST - 2 VIEW COMPARISON:  12/22/2012 FINDINGS: Frontal and lateral views of the chest demonstrate an unremarkable cardiac silhouette. There is a small right pleural effusion, with minimal right basilar consolidation. No pneumothorax. Left chest is clear. No acute bony abnormalities.  IMPRESSION: 1. Small right pleural effusion with right basilar consolidation. Given clinical history of chest pain, pulmonary embolus could give this appearance. Underlying pneumonia or atelectasis could also be considered. Electronically Signed   By: Randa Ngo M.D.   On: 02/13/2021 23:22   CT Angio Chest PE W/Cm &/Or Wo Cm  Result Date: 02/14/2021 CLINICAL DATA:  RUQ pain, goes up to R shoulder. Worse w movement, began approx 8pm 7/31. EXAM: CT ANGIOGRAPHY CHEST WITH CONTRAST TECHNIQUE: Multidetector CT imaging of the chest was performed using the standard protocol during bolus administration of intravenous contrast. Multiplanar CT image reconstructions and MIPs were obtained to evaluate the vascular anatomy. CONTRAST:  36m OMNIPAQUE IOHEXOL 350 MG/ML SOLN  COMPARISON:  Chest x-ray 02/14/2021 FINDINGS: Cardiovascular: Satisfactory opacification of the pulmonary arteries to the segmental level. Segmental and subsegmental right lower lobe pulmonary embolus. Subsegmental left upper lobe pulmonary embolus. Right to left ventricular ratio of 1. Paradoxical bowing of the interventricular septum. Mitral annular calcifications at least moderate. No significant pericardial effusion. The thoracic aorta is normal in caliber. Mild atherosclerotic plaque of the thoracic aorta. No coronary artery calcifications. Mediastinum/Nodes: There is a 2 cm right hilar lymph node. No enlarged mediastinal or axillary lymph nodes. Thyroid gland, trachea, and esophagus demonstrate no significant findings. Small hiatal hernia. Lungs/Pleura: Patchy airspace opacity of the right lower lobe as well as interval development of right base peripheral wedge-shaped airspace opacity. Trace right pleural effusion. No left pleural effusion. Pulmonary micronodule within the right middle lobe. 5 mm pulmonary nodule within the right lower lobe (6:78). No pulmonary mass. Upper Abdomen: No acute abnormality. Musculoskeletal: No chest wall abnormality.  No suspicious lytic or blastic osseous lesions. No acute displaced fracture. Multilevel degenerative changes of the spine. Review of the MIP images confirms the above findings. IMPRESSION: 1. Right lower lobe segmental and subsegmental pulmonary embolus with likely developing right lower lobe pulmonary infarction. Subsegmental left upper lobe pulmonary embolus. Findings suggestive of associated right heart strain. 2. Trace right pleural effusion with patchy airspace opacities suggestive of infection/inflammation. Likely reactive 2 cm right hilar lymph node. Followup PA and lateral chest X-ray is recommended in 3-4 weeks following therapy to ensure resolution and exclude underlying malignancy. 3. A 5 mm right lower lobe pulmonary nodule. No follow-up needed if patient is low-risk. Non-contrast chest CT can be considered in 12 months if patient is high-risk. This recommendation follows the consensus statement: Guidelines for Management of Incidental Pulmonary Nodules Detected on CT Images: From the Fleischner Society 2017; Radiology 2017; 284:228-243. 4. Small hiatal hernia. 5. Aortic Atherosclerosis (ICD10-I70.0) including mitral annular calcifications. These results were called by telephone at the time of interpretation on 02/14/2021 at 2:35am to provider Dr. Waverly Ferrari, who verbally acknowledged these results. Electronically Signed   By: Iven Finn M.D.   On: 02/14/2021 02:48   US Abdomen Limited  Result Date: 02/13/2021 CLINICAL DATA:  Right upper quadrant abdominal pain EXAM: ULTRASOUND ABDOMEN LIMITED RIGHT UPPER QUADRANT COMPARISON:  None. FINDINGS: Gallbladder: No gallstones or wall thickening visualized. No sonographic Murphy sign noted by sonographer. Common bile duct: Diameter: 3 mm in proximal diameter Liver: Hepatic parenchymal echogenicity and echotexture is normal. There is a 14 x 10 x 16 mm hyperechoic mass within the left hepatic lobe (image # 23) most compatible with a hemangioma in a patient  without a history of malignancy. No other focal intrahepatic masses are identified. There is no intrahepatic biliary ductal dilation. Portal vein is patent on color Doppler imaging with normal direction of blood flow towards the liver. Other: None. IMPRESSION: No acute abnormality. 16 mm hyperechoic mass within the left hepatic lobe, likely representing a hemangioma in a patient without a history of malignancy. Electronically Signed   By: Fidela Salisbury MD   On: 02/13/2021 23:56    Procedures Procedures  CRITICAL CARE Performed by: Quintella Reichert   Total critical care time: 45 minutes  Critical care time was exclusive of separately billable procedures and treating other patients.  Critical care was necessary to treat or prevent imminent or life-threatening deterioration.  Critical care was time spent personally by me on the following activities: development of treatment plan with patient and/or surrogate as well as nursing, discussions with  consultants, evaluation of patient's response to treatment, examination of patient, obtaining history from patient or surrogate, ordering and performing treatments and interventions, ordering and review of laboratory studies, ordering and review of radiographic studies, pulse oximetry and re-evaluation of patient's condition.  Medications Ordered in ED Medications  albuterol (VENTOLIN HFA) 108 (90 Base) MCG/ACT inhaler 6 puff (6 puffs Inhalation Given 02/13/21 2256)  AeroChamber Plus Flo-Vu Large MISC 1 each (has no administration in time range)  diltiazem (CARDIZEM) 1 mg/mL load via infusion 10 mg (10 mg Intravenous Bolus from Bag 02/14/21 0124)    And  diltiazem (CARDIZEM) 125 mg in dextrose 5% 125 mL (1 mg/mL) infusion (15 mg/hr Intravenous Infusion Verify 02/14/21 0302)  heparin ADULT infusion 100 units/mL (25000 units/247m) (900 Units/hr Intravenous New Bag/Given 02/14/21 0328)  iohexol (OMNIPAQUE) 350 MG/ML injection 64 mL (64 mLs Intravenous Contrast Given  02/14/21 0221)  heparin bolus via infusion 5,000 Units (5,000 Units Intravenous Bolus from Bag 02/14/21 0328)    ED Course  I have reviewed the triage vital signs and the nursing notes.  Pertinent labs & imaging results that were available during my care of the patient were reviewed by me and considered in my medical decision making (see chart for details).    MDM Rules/Calculators/A&P                          Pt with hx/o HTN here for evaluation of RUQ/right lower chest pain.  She is in new onset afib with RVR.  Concern for PE and CTA was obtained, which demonstrates PE with right heart strain.  Pt started on heparin for afib/PE as well as cardizem for afib with RVR.  Discussed findings of studies with Intensivist - recommends hospitalist admission.  Hospitalist consulted for admission.  Pt updated of findings of studies and recommendation for admission and she is in agreement with treatment plan.    Final Clinical Impression(s) / ED Diagnoses Final diagnoses:  Other acute pulmonary embolism with acute cor pulmonale (HCC)  Rapid atrial fibrillation ([facility]    Rx / DC Orders ED Discharge Orders     None        RQuintella Reichert MD 02/14/21 [phone]

## 2021-02-14 NOTE — Progress Notes (Signed)
Pt medication not available at this time. RT will notify RN and recheck for medication. RT will continue to monitor.

## 2021-02-14 NOTE — Progress Notes (Signed)
Lower extremity venous has been completed.   Preliminary results in CV Proc.   Abram Sander 02/14/2021 3:32 PM

## 2021-02-14 NOTE — Assessment & Plan Note (Addendum)
-   does not appear to be provoked (acitve/still working, no recent trips or surgeries; no known malignancy) - continue heparin drip - remains on RA - check echo and LE duplex - will need to transition to [place] likely tomorrow

## 2021-02-14 NOTE — Hospital Course (Addendum)
Helen Stanton is a 75 yo female with PMH HTN, asthma who presented to the ER with complaints of right upper quadrant abdominal/right lower chest pain.  The pain had started approximately Sunday and was especially worse with deep inspiration. She has no known prior history of DVT/PE and no family history of similar.  She denied any recent surgeries or recent travel.  She underwent CTA chest which showed a right lower lobe segmental and subsegmental PE with likely development of right lower lobe pulmonary infarction.  Also subsegmental left upper lobe PE noted.  Trace right pleural effusion also appreciated and a probable 2 cm right hilar reactive lymph node.  A 5 mm right lower lobe pulmonary nodule was also noted and recommended to have outpatient repeat imaging. She was started on a heparin drip in setting of underlying bilateral PEs.  During work-up, she was also noted to be in A. fib with RVR.  For this she was also started on a Cardizem drip.  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 She is active during the days and as noted above seems to have no other associated risk factors for VTE.

## 2021-02-14 NOTE — Assessment & Plan Note (Signed)
-   likely precipitated by PE - converted on cardizem in the ER; will stop drip and monitor off - continue tele - continue heparin drip (see PE) - check TSH

## 2021-02-14 NOTE — ED Notes (Signed)
Diltiazem gtt stopped per [place], MD request. Pt in NSR at this time with PACs.

## 2021-02-14 NOTE — H&P (Signed)
History and Physical    Helen Stanton  [id number]  DOB: 09-30-1945  DOA: 02/13/2021  PCP: Seward Carol, MD Patient coming from: home  Chief Complaint: RUQ abdominal/right CP  HPI:  Helen Stanton is a 74 yo female with PMH HTN, asthma who presented to the ER with complaints of right upper quadrant abdominal/right lower chest pain.  The pain had started approximately Sunday and was especially worse with deep inspiration. She has no known prior history of DVT/PE and no family history of similar.  She denied any recent surgeries or recent travel.  She underwent CTA chest which showed a right lower lobe segmental and subsegmental PE with likely development of right lower lobe pulmonary infarction.  Also subsegmental left upper lobe PE noted.  Trace right pleural effusion also appreciated and a probable 2 cm right hilar reactive lymph node.  A 5 mm right lower lobe pulmonary nodule was also noted and recommended to have outpatient repeat imaging. She was started on a heparin drip in setting of underlying bilateral PEs.  During work-up, she was also noted to be in A. fib with RVR.  For this she was also started on a Cardizem drip.  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 She is active during the days and as noted above seems to have no other associated risk factors for VTE.   I have personally briefly reviewed patient's old medical records in [facility] and discussed patient with the ER provider when appropriate/indicated.  Assessment/Plan: Pulmonary embolism (HCC) - does not appear to be provoked (acitve/still working, no recent trips or surgeries; no known malignancy) - continue heparin drip - remains on RA - check echo and LE duplex - will need to transition to [place] likely tomorrow  Atrial fibrillation with RVR ([place]) - likely precipitated by PE - converted on cardizem in the ER; will stop drip and monitor off - continue tele - continue  heparin drip (see PE) - check TSH  Asthma -continue singulair   HTN (hypertension) - continue lisinopril/hctz     Code Status:     Code Status: Full Code  DVT Prophylaxis: heparin drip     Anticipated disposition is to: home  History: Past Medical History:  Diagnosis Date   Asthma    HTN (hypertension) 01/08/2012   Hypertension     Past Surgical History:  Procedure Laterality Date   TUBAL LIGATION  circa 1975     reports that she does not drink alcohol and does not use drugs. No history on file for tobacco use.  Allergies  Allergen Reactions   Fish Allergy Shortness Of Breath   Milk-Related Compounds Shortness Of Breath    Family History  Problem Relation Age of Onset   Hypertension Mother    Dementia Mother    Hypertension Father    Dementia Father    Diabetes Father     Home Medications: Prior to Admission medications   Medication Sig Start Date End Date Taking? Authorizing Provider  albuterol (PROVENTIL HFA;VENTOLIN HFA) 108 (90 BASE) MCG/ACT inhaler Inhale 2 puffs into the lungs every 6 (six) hours as needed. For wheezing   Yes [provider]  Apoaequorin (PREVAGEN PO) Take 1 tablet by mouth daily. Take 1 tablet by mouth once daily   Yes [provider]  aspirin EC 81 MG tablet Take 81 mg by mouth daily.   Yes [provider]  calcium carbonate (OS-CAL - DOSED IN MG OF ELEMENTAL  CALCIUM) 1250 MG tablet Take 1 tablet by mouth 3 (three) times daily.   Yes [provider]  cholecalciferol (VITAMIN D) 1000 UNITS tablet Take 1,000 Units by mouth daily.   Yes [provider]  lisinopril-hydrochlorothiazide (PRINZIDE,ZESTORETIC) 20-25 MG per tablet Take 1 tablet by mouth daily. 06/04/13  Yes Jerline Pain, MD  montelukast (SINGULAIR) 10 MG tablet Take 1 tablet by mouth daily. Take 1 tablet by mouth once daily 11/30/20  Yes [provider]  Multiple Vitamin (MULTIVITAMIN WITH MINERALS) TABS Take 1 tablet by  mouth daily.   Yes [provider]  Fluticasone-Salmeterol (ADVAIR) 250-50 MCG/DOSE AEPB Inhale 1 puff into the lungs every 12 (twelve) hours.    [provider]    Review of Systems:  Pertinent items noted in HPI and remainder of comprehensive ROS otherwise negative.  Physical Exam: Vitals:   02/14/21 1230 02/14/21 1400 02/14/21 1500 02/14/21 1550  BP: (!) 152/68 (!) 142/65 (!) 148/68 (!) 150/66  Pulse: 77 80 78   Resp: (!) 21 (!) 22 (!) 21   Temp:      SpO2: 100% 100% 100%   Weight:       General appearance: alert, cooperative, and no distress Head: Normocephalic, without obvious abnormality, atraumatic Eyes:  EOMI Lungs: clear to auscultation bilaterally Heart: regular rate and rhythm and S1, S2 normal Abdomen: normal findings: bowel sounds normal and soft, non-tender Extremities:  no edema Skin: mobility and turgor normal Neurologic: Grossly normal  Labs on Admission:  I have personally reviewed following labs and imaging studies Results for orders placed or performed during the hospital encounter of 02/13/21 (from the past 24 hour(s))  CBC with Differential/Platelet     Status: Abnormal   Collection Time: 02/13/21 11:00 PM  Result Value Ref Range   WBC 13.4 (H) 4.0 - 10.5 K/uL   RBC 5.06 3.87 - 5.11 MIL/uL   Hemoglobin 12.5 12.0 - 15.0 g/dL   HCT 39.7 36.0 - 46.0 %   MCV 78.5 (L) 80.0 - 100.0 fL   MCH 24.7 (L) 26.0 - 34.0 pg   MCHC 31.5 30.0 - 36.0 g/dL   RDW 14.2 11.5 - 15.5 %   Platelets 226 150 - 400 K/uL   nRBC 0.0 0.0 - 0.2 %   Neutrophils Relative % 76 %   Neutro Abs 10.1 (H) 1.7 - 7.7 K/uL   Lymphocytes Relative 14 %   Lymphs Abs 1.9 0.7 - 4.0 K/uL   Monocytes Relative 9 %   Monocytes Absolute 1.2 (H) 0.1 - 1.0 K/uL   Eosinophils Relative 1 %   Eosinophils Absolute 0.1 0.0 - 0.5 K/uL   Basophils Relative 0 %   Basophils Absolute 0.0 0.0 - 0.1 K/uL   Immature Granulocytes 0 %   Abs Immature Granulocytes 0.06 0.00 - 0.07 K/uL   Comprehensive metabolic panel     Status: Abnormal   Collection Time: 02/13/21 11:00 PM  Result Value Ref Range   Sodium 134 (L) 135 - 145 mmol/L   Potassium 3.8 3.5 - 5.1 mmol/L   Chloride 99 98 - 111 mmol/L   CO2 25 22 - 32 mmol/L   Glucose, Bld 136 (H) 70 - 99 mg/dL   BUN 13 8 - 23 mg/dL   Creatinine, Ser 1.13 (H) 0.44 - 1.00 mg/dL   Calcium 9.4 8.9 - 10.3 mg/dL   Total Protein 7.1 6.5 - 8.1 g/dL   Albumin 3.1 (L) 3.5 - 5.0 g/dL   AST 26 15 -  41 U/L   ALT 23 0 - 44 U/L   Alkaline Phosphatase 75 38 - 126 U/L   Total Bilirubin 1.1 0.3 - 1.2 mg/dL   GFR, Estimated 50 (L) >60 mL/min   Anion gap 10 5 - 15  Troponin I (High Sensitivity)     Status: Abnormal   Collection Time: 02/13/21 11:00 PM  Result Value Ref Range   Troponin I (High Sensitivity) 30 (H) <18 ng/L  Lipase, blood     Status: None   Collection Time: 02/13/21 11:00 PM  Result Value Ref Range   Lipase 43 11 - 51 U/L  Troponin I (High Sensitivity)     Status: Abnormal   Collection Time: 02/14/21 12:54 AM  Result Value Ref Range   Troponin I (High Sensitivity) 33 (H) <18 ng/L  Resp Panel by RT-PCR (Flu A&B, Covid) Nasopharyngeal Swab     Status: None   Collection Time: 02/14/21  1:30 AM   Specimen: Nasopharyngeal Swab; Nasopharyngeal(NP) swabs in vial transport medium  Result Value Ref Range   SARS Coronavirus 2 by RT PCR NEGATIVE NEGATIVE   Influenza A by PCR NEGATIVE NEGATIVE   Influenza B by PCR NEGATIVE NEGATIVE  Heparin level (unfractionated)     Status: None   Collection Time: 02/14/21 12:18 PM  Result Value Ref Range   Heparin Unfractionated 0.30 0.30 - 0.70 IU/mL     Radiological Exams on Admission: DG Chest 2 View  Result Date: 02/13/2021 CLINICAL DATA:  Right upper quadrant abdominal pain, short of breath, chest pain EXAM: CHEST - 2 VIEW COMPARISON:  12/22/2012 FINDINGS: Frontal and lateral views of the chest demonstrate an unremarkable cardiac silhouette. There is a small right pleural effusion, with  minimal right basilar consolidation. No pneumothorax. Left chest is clear. No acute bony abnormalities. IMPRESSION: 1. Small right pleural effusion with right basilar consolidation. Given clinical history of chest pain, pulmonary embolus could give this appearance. Underlying pneumonia or atelectasis could also be considered. Electronically Signed   By: Randa Ngo M.D.   On: 02/13/2021 23:22   CT Angio Chest PE W/Cm &/Or Wo Cm  Result Date: 02/14/2021 CLINICAL DATA:  RUQ pain, goes up to R shoulder. Worse w movement, began approx 8pm 7/31. EXAM: CT ANGIOGRAPHY CHEST WITH CONTRAST TECHNIQUE: Multidetector CT imaging of the chest was performed using the standard protocol during bolus administration of intravenous contrast. Multiplanar CT image reconstructions and MIPs were obtained to evaluate the vascular anatomy. CONTRAST:  55m OMNIPAQUE IOHEXOL 350 MG/ML SOLN COMPARISON:  Chest x-ray 02/14/2021 FINDINGS: Cardiovascular: Satisfactory opacification of the pulmonary arteries to the segmental level. Segmental and subsegmental right lower lobe pulmonary embolus. Subsegmental left upper lobe pulmonary embolus. Right to left ventricular ratio of 1. Paradoxical bowing of the interventricular septum. Mitral annular calcifications at least moderate. No significant pericardial effusion. The thoracic aorta is normal in caliber. Mild atherosclerotic plaque of the thoracic aorta. No coronary artery calcifications. Mediastinum/Nodes: There is a 2 cm right hilar lymph node. No enlarged mediastinal or axillary lymph nodes. Thyroid gland, trachea, and esophagus demonstrate no significant findings. Small hiatal hernia. Lungs/Pleura: Patchy airspace opacity of the right lower lobe as well as interval development of right base peripheral wedge-shaped airspace opacity. Trace right pleural effusion. No left pleural effusion. Pulmonary micronodule within the right middle lobe. 5 mm pulmonary nodule within the right lower lobe  (6:78). No pulmonary mass. Upper Abdomen: No acute abnormality. Musculoskeletal: No chest wall abnormality. No suspicious lytic or blastic osseous lesions. No acute  displaced fracture. Multilevel degenerative changes of the spine. Review of the MIP images confirms the above findings. IMPRESSION: 1. Right lower lobe segmental and subsegmental pulmonary embolus with likely developing right lower lobe pulmonary infarction. Subsegmental left upper lobe pulmonary embolus. Findings suggestive of associated right heart strain. 2. Trace right pleural effusion with patchy airspace opacities suggestive of infection/inflammation. Likely reactive 2 cm right hilar lymph node. Followup PA and lateral chest X-ray is recommended in 3-4 weeks following therapy to ensure resolution and exclude underlying malignancy. 3. A 5 mm right lower lobe pulmonary nodule. No follow-up needed if patient is low-risk. Non-contrast chest CT can be considered in 12 months if patient is high-risk. This recommendation follows the consensus statement: Guidelines for Management of Incidental Pulmonary Nodules Detected on CT Images: From the Fleischner Society 2017; Radiology 2017; 284:228-243. 4. Small hiatal hernia. 5. Aortic Atherosclerosis (ICD10-I70.0) including mitral annular calcifications. These results were called by telephone at the time of interpretation on 02/14/2021 at 2:35am to provider Dr. Waverly Ferrari, who verbally acknowledged these results. Electronically Signed   By: Iven Finn M.D.   On: 02/14/2021 02:48   US Abdomen Limited  Result Date: 02/13/2021 CLINICAL DATA:  Right upper quadrant abdominal pain EXAM: ULTRASOUND ABDOMEN LIMITED RIGHT UPPER QUADRANT COMPARISON:  None. FINDINGS: Gallbladder: No gallstones or wall thickening visualized. No sonographic Murphy sign noted by sonographer. Common bile duct: Diameter: 3 mm in proximal diameter Liver: Hepatic parenchymal echogenicity and echotexture is normal. There is a 14 x 10 x 16 mm  hyperechoic mass within the left hepatic lobe (image # 23) most compatible with a hemangioma in a patient without a history of malignancy. No other focal intrahepatic masses are identified. There is no intrahepatic biliary ductal dilation. Portal vein is patent on color Doppler imaging with normal direction of blood flow towards the liver. Other: None. IMPRESSION: No acute abnormality. 16 mm hyperechoic mass within the left hepatic lobe, likely representing a hemangioma in a patient without a history of malignancy. Electronically Signed   By: Fidela Salisbury MD   On: 02/13/2021 23:56   VAS [place] LOWER EXTREMITY VENOUS (DVT)  Result Date: 02/14/2021  Lower Venous DVT Study Patient Name:  Helen Stanton  Date of Exam:   02/14/2021 Medical Rec #: [id number]      Accession #:    [id number] Date of Birth: Nov 21, 1945      Patient Gender: F Patient Age:   75Y Exam Location:  [facility] Procedure:      VAS [place] LOWER EXTREMITY VENOUS (DVT) Referring Phys: [id number] Sharece Fleischhacker --------------------------------------------------------------------------------  Indications: Pulmonary embolism.  Comparison Study: no prior Performing Technologist: Archie Patten RVS  Examination Guidelines: A complete evaluation includes B-mode imaging, spectral Doppler, color Doppler, and power Doppler as needed of all accessible portions of each vessel. Bilateral testing is considered an integral part of a complete examination. Limited examinations for reoccurring indications may be performed as noted. The reflux portion of the exam is performed with the patient in reverse Trendelenburg.  +---------+---------------+---------+-----------+----------+-------------------+ RIGHT    CompressibilityPhasicitySpontaneityPropertiesThrombus Aging      +---------+---------------+---------+-----------+----------+-------------------+ CFV      Full           Yes      Yes                                       +---------+---------------+---------+-----------+----------+-------------------+ SFJ      Full                                                             +---------+---------------+---------+-----------+----------+-------------------+  FV Prox  Full                                                             +---------+---------------+---------+-----------+----------+-------------------+ FV Mid   Full                                                             +---------+---------------+---------+-----------+----------+-------------------+ FV DistalFull                                                             +---------+---------------+---------+-----------+----------+-------------------+ PFV      Full                                                             +---------+---------------+---------+-----------+----------+-------------------+ POP      Full           Yes      Yes                                      +---------+---------------+---------+-----------+----------+-------------------+ PTV      Full                                                             +---------+---------------+---------+-----------+----------+-------------------+ PERO                                                  Not well visualized +---------+---------------+---------+-----------+----------+-------------------+   +---------+---------------+---------+-----------+----------+--------------+ LEFT     CompressibilityPhasicitySpontaneityPropertiesThrombus Aging +---------+---------------+---------+-----------+----------+--------------+ CFV      Full           Yes      Yes                                 +---------+---------------+---------+-----------+----------+--------------+ SFJ      Full                                                        +---------+---------------+---------+-----------+----------+--------------+ FV Prox  Full                                                         +---------+---------------+---------+-----------+----------+--------------+  FV Mid   Full                                                        +---------+---------------+---------+-----------+----------+--------------+ FV DistalFull                                                        +---------+---------------+---------+-----------+----------+--------------+ PFV      Full                                                        +---------+---------------+---------+-----------+----------+--------------+ POP      Full           Yes      Yes                                 +---------+---------------+---------+-----------+----------+--------------+ PTV      Full                                                        +---------+---------------+---------+-----------+----------+--------------+ PERO     Full                                                        +---------+---------------+---------+-----------+----------+--------------+     Summary: BILATERAL: - No evidence of deep vein thrombosis seen in the lower extremities, bilaterally. -No evidence of popliteal cyst, bilaterally.   *See table(s) above for measurements and observations.    Preliminary    VAS [place] LOWER EXTREMITY VENOUS (DVT)      CT Angio Chest PE W/Cm &/Or Wo Cm  Final Result    US Abdomen Limited  Final Result    DG Chest 2 View  Final Result      Consults called:     EKG: Independently reviewed. Afib with RVR   Dwyane Dee, MD [facility] 02/14/2021, 3:56 PM

## 2021-02-14 NOTE — Progress Notes (Signed)
ANTICOAGULATION CONSULT NOTE - Initial Consult  Pharmacy Consult for Heparin  Indication: pulmonary embolus  Allergies  Allergen Reactions   Fish Allergy Shortness Of Breath   Milk-Related Compounds Shortness Of Breath     Vital Signs: Temp: 100 F (37.8 C) (08/01 2152) BP: 119/65 (08/02 0245) Pulse Rate: 124 (08/02 0245)  Labs: Recent Labs    02/13/21 2300 02/14/21 0054  HGB 12.5  --   HCT 39.7  --   PLT 226  --   CREATININE 1.13*  --   TROPONINIHS 30* 33*    CrCl cannot be calculated (Unknown ideal weight.).   Medical History: Past Medical History:  Diagnosis Date   Asthma    HTN (hypertension) 01/08/2012   Hypertension      Assessment: 75 y/o F with right upper quadrant pain found to have new onset pulmonary embolus per CT Angio. Start heparin per pharmacy. CBC/renal function ok. PTA meds reviewed.   Goal of Therapy:  Heparin level 0.3-0.7 units/ml Monitor platelets by anticoagulation protocol: Yes   Plan:  Heparin 5000 units BOLUS Start heparin drip at 900 units/hr 1200 Heparin level Daily CBC/Heparin level Monitor for bleeding  Narda Bonds, PharmD, BCPS Clinical Pharmacist Phone: [phone]

## 2021-02-15 ENCOUNTER — Encounter (HOSPITAL_COMMUNITY): Payer: Self-pay | Admitting: Internal Medicine

## 2021-02-15 ENCOUNTER — Other Ambulatory Visit: Payer: Self-pay

## 2021-02-15 ENCOUNTER — Inpatient Hospital Stay (HOSPITAL_COMMUNITY): Payer: BC Managed Care – PPO

## 2021-02-15 DIAGNOSIS — I4891 Unspecified atrial fibrillation: Secondary | ICD-10-CM | POA: Diagnosis not present

## 2021-02-15 LAB — BASIC METABOLIC PANEL
Anion gap: 10 (ref 5–15)
BUN: 17 mg/dL (ref 8–23)
CO2: 26 mmol/L (ref 22–32)
Calcium: 8.9 mg/dL (ref 8.9–10.3)
Chloride: 99 mmol/L (ref 98–111)
Creatinine, Ser: 1.15 mg/dL — ABNORMAL HIGH (ref 0.44–1.00)
GFR, Estimated: 49 mL/min — ABNORMAL LOW (ref >60)
Glucose, Bld: 106 mg/dL — ABNORMAL HIGH (ref 70–99)
Potassium: 4 mmol/L (ref 3.5–5.1)
Sodium: 135 mmol/L (ref 135–145)

## 2021-02-15 LAB — HEMOGLOBIN A1C
Hgb A1c MFr Bld: 6 % — ABNORMAL HIGH (ref 4.8–5.6)
Mean Plasma Glucose: 125.5 mg/dL

## 2021-02-15 LAB — CBC WITH DIFFERENTIAL/PLATELET
Abs Immature Granulocytes: 0.04 10*3/uL (ref 0.00–0.07)
Basophils Absolute: 0.1 10*3/uL (ref 0.0–0.1)
Basophils Relative: 0 %
Eosinophils Absolute: 0.1 10*3/uL (ref 0.0–0.5)
Eosinophils Relative: 1 %
HCT: 36 % (ref 36.0–46.0)
Hemoglobin: 11.1 g/dL — ABNORMAL LOW (ref 12.0–15.0)
Immature Granulocytes: 0 %
Lymphocytes Relative: 18 %
Lymphs Abs: 2 10*3/uL (ref 0.7–4.0)
MCH: 23.8 pg — ABNORMAL LOW (ref 26.0–34.0)
MCHC: 30.8 g/dL (ref 30.0–36.0)
MCV: 77.1 fL — ABNORMAL LOW (ref 80.0–100.0)
Monocytes Absolute: 1.1 10*3/uL — ABNORMAL HIGH (ref 0.1–1.0)
Monocytes Relative: 10 %
Neutro Abs: 7.8 10*3/uL — ABNORMAL HIGH (ref 1.7–7.7)
Neutrophils Relative %: 71 %
Platelets: 191 10*3/uL (ref 150–400)
RBC: 4.67 MIL/uL (ref 3.87–5.11)
RDW: 14 % (ref 11.5–15.5)
WBC: 11.2 10*3/uL — ABNORMAL HIGH (ref 4.0–10.5)
nRBC: 0 % (ref 0.0–0.2)

## 2021-02-15 LAB — LIPID PANEL
Cholesterol: 192 mg/dL (ref 0–200)
HDL: 62 mg/dL (ref >40)
LDL Cholesterol: 112 mg/dL — ABNORMAL HIGH (ref 0–99)
Total CHOL/HDL Ratio: 3.1 RATIO
Triglycerides: 89 mg/dL (ref <150)
VLDL: 18 mg/dL (ref 0–40)

## 2021-02-15 LAB — ECHOCARDIOGRAM COMPLETE
Height: 61 in
Radius: 0.4 cm
S' Lateral: 2.7 cm
Weight: 3024 oz

## 2021-02-15 LAB — TSH: TSH: 4.594 u[IU]/mL — ABNORMAL HIGH (ref 0.350–4.500)

## 2021-02-15 LAB — HEPARIN LEVEL (UNFRACTIONATED)
Heparin Unfractionated: 0.28 IU/mL — ABNORMAL LOW (ref 0.30–0.70)
Heparin Unfractionated: 0.61 IU/mL (ref 0.30–0.70)

## 2021-02-15 LAB — MAGNESIUM: Magnesium: 2 mg/dL (ref 1.7–2.4)

## 2021-02-15 MED ORDER — IBUPROFEN 400 MG PO TABS
400.0000 mg | ORAL_TABLET | Freq: Three times a day (TID) | ORAL | Status: DC | PRN
  Administered 2021-02-15 – 2021-02-16 (×2): 400 mg via ORAL
  Filled 2021-02-15 (×2): qty 1

## 2021-02-15 MED ORDER — IBUPROFEN 400 MG PO TABS: 400.0000 mg | ORAL_TABLET | Freq: Once | ORAL | Status: DC | PRN

## 2021-02-15 MED ORDER — HEPARIN BOLUS VIA INFUSION
1000.0000 [IU] | Freq: Once | INTRAVENOUS | Status: AC
  Administered 2021-02-15: 1000 [IU] via INTRAVENOUS
  Filled 2021-02-15: qty 1000

## 2021-02-15 MED ORDER — METOPROLOL TARTRATE 5 MG/5ML IV SOLN
5.0000 mg | INTRAVENOUS | Status: DC | PRN
  Filled 2021-02-15: qty 5

## 2021-02-15 MED ORDER — DILTIAZEM HCL-DEXTROSE 125-5 MG/125ML-% IV SOLN (PREMIX)
5.0000 mg/h | INTRAVENOUS | Status: DC
  Administered 2021-02-15: 5 mg/h via INTRAVENOUS
  Filled 2021-02-15 (×2): qty 125

## 2021-02-15 MED ORDER — IBUPROFEN 400 MG PO TABS
400.0000 mg | ORAL_TABLET | Freq: Once | ORAL | Status: AC | PRN
  Administered 2021-02-15: 400 mg via ORAL
  Filled 2021-02-15: qty 1

## 2021-02-15 NOTE — Progress Notes (Signed)
ANTICOAGULATION CONSULT NOTE   Pharmacy Consult for Heparin  Indication: pulmonary embolus  Allergies  Allergen Reactions   Fish Allergy Shortness Of Breath   Milk-Related Compounds Shortness Of Breath     Vital Signs: Temp: 98.6 F (37 C) (08/03 0230) Temp Source: Oral (08/03 0230) BP: 146/60 (08/03 0033) Pulse Rate: 82 (08/03 0033)  Labs: Recent Labs    02/13/21 2300 02/14/21 0054 02/14/21 1218 02/15/21 0022  HGB 12.5  --   --  11.1*  HCT 39.7  --   --  36.0  PLT 226  --   --  191  HEPARINUNFRC  --   --  0.30 0.28*  CREATININE 1.13*  --   --  1.15*  TROPONINIHS 30* 33*  --   --      Estimated Creatinine Clearance: 40.7 mL/min (A) (by C-G formula based on SCr of 1.15 mg/dL (H)).   Medical History: Past Medical History:  Diagnosis Date   Asthma    HTN (hypertension) 01/08/2012   Hypertension      Assessment: 75 y/o F with right upper quadrant pain found to have new onset pulmonary embolus per CT Angio. Start heparin per pharmacy. CBC/renal function ok. PTA meds reviewed.   8/3 AM update:  Heparin level below goal  Goal of Therapy:  Heparin level 0.3-0.7 units/ml Monitor platelets by anticoagulation protocol: Yes   Plan:  Heparin 1000 units BOLUS x 1 Inc heparin to 1100 units/hr 1100 Heparin level Daily CBC/Heparin level Monitor for bleeding  Narda Bonds, PharmD, BCPS Clinical Pharmacist Phone: [phone]

## 2021-02-15 NOTE — Progress Notes (Signed)
  Echocardiogram 2D Echocardiogram has been performed.  Fidel Levy 02/15/2021, 11:45 AM

## 2021-02-15 NOTE — Progress Notes (Signed)
ANTICOAGULATION CONSULT NOTE   Pharmacy Consult for Heparin  Indication: pulmonary embolus  Allergies  Allergen Reactions   Fish Allergy Shortness Of Breath   Milk-Related Compounds Shortness Of Breath     Vital Signs: Temp: 98.6 F (37 C) (08/03 1134) Temp Source: Oral (08/03 1134) BP: 107/75 (08/03 1134) Pulse Rate: 101 (08/03 1134)  Labs: Recent Labs    02/13/21 2300 02/14/21 0054 02/14/21 1218 02/15/21 0022 02/15/21 1056  HGB 12.5  --   --  11.1*  --   HCT 39.7  --   --  36.0  --   PLT 226  --   --  191  --   HEPARINUNFRC  --   --  0.30 0.28* 0.61  CREATININE 1.13*  --   --  1.15*  --   TROPONINIHS 30* 33*  --   --   --      Estimated Creatinine Clearance: 40.7 mL/min (A) (by C-G formula based on SCr of 1.15 mg/dL (H)).   Medical History: Past Medical History:  Diagnosis Date   Asthma    HTN (hypertension) 01/08/2012   Hypertension      Assessment: 75 y/o F with right upper quadrant pain found to have new onset pulmonary embolus per CT Angio (dopplers negative). She may transition to a DOAC today.  -apixaban cost is @ per month -heparin level at goal   Goal of Therapy:  Heparin level 0.3-0.7 units/ml Monitor platelets by anticoagulation protocol: Yes   Plan:  Continue heparin at 1100 units.hr Daily CBC/Heparin level

## 2021-02-15 NOTE — Progress Notes (Signed)
PROGRESS NOTE    Helen Stanton  [id number] DOB: 1945/10/14 DOA: 02/13/2021 PCP: Seward Carol, MD   Chief Complaint  Patient presents with   Abdominal Pain   Brief Narrative: 75 year old female with hypertension asthma presented with right upper quadrant abdominal pain right lower chest pain started "Sunday, pleuritic in nature.  No prior episode DVT PE or recent travel or surgeries. ED CT was done that showed a right lower lobe segmental and subsegmental PE development of right lower lobe pulmonary infarct.  5 mm right lower lobe pulmonary nodule patient was placed on heparin drip and admitted.  Also found A. fib with RVR.  Subjective: No chest pain, some on deep breath. Was In NSR but back in A fib this afternoon On RA.  Assessment & Plan:  Right lower lobe segmental and subsegmental PE Development of right lower lobe pulmonary infarct: Cont on heaprin gtt. Suspect unprovoked. Echo pending, troponin 33> 30.  Atrial fibrillation with RVR- converted to NSR after cardizem-pt back in Afib now HR 130-140s. We will resume cardizem gtt.  Elevated troponin likely suspect in the setting of PE A. fib with RVR tachycardia and demand mismatch related.  Obtain echocardiogram when heart rate stable.  5 mm right lower lobe pulmonary nodule- need f/u CT  HTN-on hctz/lisinipril-hold as blood pressures are fairly stable and starting Cardizem drip  Asthma-on dulera continue the same, not in exacerbation  Morbid obesity with BMI 35: She will benefit with weight loss, PCP follow-up and healthy lifestyle.  Diet Order             Diet regular Room service appropriate? Yes; Fluid consistency: Thin  Diet effective now                 Patient's Body mass index is 35.71 kg/m. DVT prophylaxis: heparin gtt Code Status:   Code Status: Full Code  Family Communication: plan of care discussed with patient at bedside.  Status is: Inpatient Remains inpatient appropriate because:IV treatments  appropriate due to intensity of illness or inability to take PO and Inpatient level of care appropriate due to severity of illness Dispo: The patient is from: Home              Anticipated d/c is to: Home              Patient currently is not medically stable to d/c.   Difficult to place patient No Unresulted Labs (From admission, onward)     Start     Ordered   02/15/21 0500  Basic metabolic panel  Daily,   R      02/14/21 1506   02/15/21 0500  CBC with Differential/Platelet  Daily,   R      02/14/21 1506   02/15/21 0500  Magnesium  Daily,   R      02/14/21 1506   02/15/21 0500  Heparin level (unfractionated)  Daily,   R      08"$ /02/22 1412           Medications reviewed:  Scheduled Meds:  AeroChamber Plus Flo-Vu Large  1 each Other Once   aspirin EC  81 mg Oral Daily   hydrochlorothiazide  25 mg Oral Daily   lisinopril  20 mg Oral Daily   mometasone-formoterol  2 puff Inhalation BID   montelukast  10 mg Oral Daily   multivitamin with minerals  1 tablet Oral Daily   sodium chloride flush  3 mL Intravenous Q12H   Continuous Infusions:  diltiazem (  CARDIZEM) infusion     heparin 1,100 Units/hr (02/15/21 0249)   Consultants:see note  Procedures:see note Antimicrobials: Anti-infectives (From admission, onward)    None      Culture/Microbiology No results found for: SDES, SPECREQUEST, CULT, REPTSTATUS  Other culture-see note  Objective: Vitals: Today's Vitals   02/15/21 0750 02/15/21 0834 02/15/21 0900 02/15/21 1134  BP:  (!) 140/51  107/75  Pulse:  78  (!) 101  Resp:  14  17  Temp:  97.8 F (36.6 C)  98.6 F (37 C)  TempSrc:  Oral  Oral  SpO2: 100% 100%  100%  Weight:      Height:      PainSc:   2      Intake/Output Summary (Last 24 hours) at 02/15/2021 1212 Last data filed at 02/15/2021 0157 Gross per 24 hour  Intake 256.11 ml  Output 300 ml  Net -43.89 ml   Filed Weights   02/14/21 0303 02/15/21 0138  Weight: 85.7 kg 85.7 kg   Weight change: 0 kg   Intake/Output from previous day: 08/02 0701 - 08/03 0700 In: 256.1 [I.V.:256.1] Out: 300 [Urine:300] Intake/Output this shift: No intake/output data recorded. Filed Weights   02/14/21 0303 02/15/21 0138  Weight: 85.7 kg 85.7 kg   Examination: General exam: AAOx3, older than stated age, weak appearing. HEENT:Oral mucosa moist, Ear/Nose WNL grossly,dentition normal. Respiratory system: bilaterally diminished, no use of accessory muscle, non tender. Cardiovascular system: S1 & S2 +, irregularly irregular no JVD. Gastrointestinal system: Abdomen soft, NT,ND, BS+. Nervous System:Alert, awake, moving extremities Extremities: no edema, distal peripheral pulses palpable.  Skin: No rashes,no icterus. MSK: Normal muscle bulk,tone, power Data Reviewed: I have personally reviewed following labs and imaging studies CBC: Recent Labs  Lab 02/13/21 2300 02/15/21 0022  WBC 13.4* 11.2*  NEUTROABS 10.1* 7.8*  HGB 12.5 11.1*  HCT 39.7 36.0  MCV 78.5* 77.1*  PLT 226 [id number]   Basic Metabolic Panel: Recent Labs  Lab 02/13/21 2300 02/15/21 0022  NA 134* 135  K 3.8 4.0  CL 99 99  CO2 25 26  GLUCOSE 136* 106*  BUN 13 17  CREATININE 1.13* 1.15*  CALCIUM 9.4 8.9  MG  --  2.0   GFR: Estimated Creatinine Clearance: 40.7 mL/min (A) (by C-G formula based on SCr of 1.15 mg/dL (H)). Liver Function Tests: Recent Labs  Lab 02/13/21 2300  AST 26  ALT 23  ALKPHOS 75  BILITOT 1.1  PROT 7.1  ALBUMIN 3.1*   Recent Labs  Lab 02/13/21 2300  LIPASE 43   No results for input(s): AMMONIA in the last 168 hours. Coagulation Profile: No results for input(s): INR, PROTIME in the last 168 hours. Cardiac Enzymes: No results for input(s): CKTOTAL, CKMB, CKMBINDEX, TROPONINI in the last 168 hours. BNP (last 3 results) No results for input(s): PROBNP in the last 8760 hours. HbA1C: Recent Labs    02/15/21 0022  HGBA1C 6.0*   CBG: No results for input(s): GLUCAP in the last 168 hours. Lipid  Profile: Recent Labs    02/15/21 0022  CHOL 192  HDL 62  LDLCALC 112*  TRIG 89  CHOLHDL 3.1   Thyroid Function Tests: Recent Labs    02/15/21 0022  TSH 4.594*   Anemia Panel: No results for input(s): VITAMINB12, FOLATE, FERRITIN, TIBC, IRON, RETICCTPCT in the last 72 hours. Sepsis Labs: No results for input(s): PROCALCITON, LATICACIDVEN in the last 168 hours.  Recent Results (from the past 240 hour(s))  Resp Panel by RT-PCR (Flu  A&B, Covid) Nasopharyngeal Swab     Status: None   Collection Time: 02/14/21  1:30 AM   Specimen: Nasopharyngeal Swab; Nasopharyngeal(NP) swabs in vial transport medium  Result Value Ref Range Status   SARS Coronavirus 2 by RT PCR NEGATIVE NEGATIVE Final    Comment: (NOTE) SARS-CoV-2 target nucleic acids are NOT DETECTED.  The SARS-CoV-2 RNA is generally detectable in upper respiratory specimens during the acute phase of infection. The lowest concentration of SARS-CoV-2 viral copies this assay can detect is 138 copies/mL. A negative result does not preclude SARS-Cov-2 infection and should not be used as the sole basis for treatment or other patient management decisions. A negative result may occur with  improper specimen collection/handling, submission of specimen other than nasopharyngeal swab, presence of viral mutation(s) within the areas targeted by this assay, and inadequate number of viral copies(<138 copies/mL). A negative result must be combined with clinical observations, patient history, and epidemiological information. The expected result is Negative.  Fact Sheet for Patients:  EntrepreneurPulse.com.au  Fact Sheet for Healthcare Providers:  IncredibleEmployment.be  This test is no t yet approved or cleared by the [place] FDA and  has been authorized for detection and/or diagnosis of SARS-CoV-2 by FDA under an Emergency Use Authorization (EUA). This EUA will remain  in effect (meaning this  test can be used) for the duration of the COVID-19 declaration under Section 564(b)(1) of the Act, 21 U.S.C.section 360bbb-3(b)(1), unless the authorization is terminated  or revoked sooner.       Influenza A by PCR NEGATIVE NEGATIVE Final   Influenza B by PCR NEGATIVE NEGATIVE Final    Comment: (NOTE) The Xpert Xpress SARS-CoV-2/FLU/RSV plus assay is intended as an aid in the diagnosis of influenza from Nasopharyngeal swab specimens and should not be used as a sole basis for treatment. Nasal washings and aspirates are unacceptable for Xpert Xpress SARS-CoV-2/FLU/RSV testing.  Fact Sheet for Patients: EntrepreneurPulse.com.au  Fact Sheet for Healthcare Providers: IncredibleEmployment.be  This test is not yet approved or cleared by the [place] FDA and has been authorized for detection and/or diagnosis of SARS-CoV-2 by FDA under an Emergency Use Authorization (EUA). This EUA will remain in effect (meaning this test can be used) for the duration of the COVID-19 declaration under Section 564(b)(1) of the Act, 21 U.S.C. section 360bbb-3(b)(1), unless the authorization is terminated or revoked.  Performed at [facility], [place] [address], [place], [place] [address]      Radiology Studies: DG Chest 2 View  Result Date: 02/13/2021 CLINICAL DATA:  Right upper quadrant abdominal pain, short of breath, chest pain EXAM: CHEST - 2 VIEW COMPARISON:  12/22/2012 FINDINGS: Frontal and lateral views of the chest demonstrate an unremarkable cardiac silhouette. There is a small right pleural effusion, with minimal right basilar consolidation. No pneumothorax. Left chest is clear. No acute bony abnormalities. IMPRESSION: 1. Small right pleural effusion with right basilar consolidation. Given clinical history of chest pain, pulmonary embolus could give this appearance. Underlying pneumonia or atelectasis could also be considered. Electronically Signed    By: Randa Ngo M.D.   On: 02/13/2021 23:22   CT Angio Chest PE W/Cm &/Or Wo Cm  Result Date: 02/14/2021 CLINICAL DATA:  RUQ pain, goes up to R shoulder. Worse w movement, began approx 8pm 7/31. EXAM: CT ANGIOGRAPHY CHEST WITH CONTRAST TECHNIQUE: Multidetector CT imaging of the chest was performed using the standard protocol during bolus administration of intravenous contrast. Multiplanar CT image reconstructions and MIPs were obtained to  evaluate the vascular anatomy. CONTRAST:  17m OMNIPAQUE IOHEXOL 350 MG/ML SOLN COMPARISON:  Chest x-ray 02/14/2021 FINDINGS: Cardiovascular: Satisfactory opacification of the pulmonary arteries to the segmental level. Segmental and subsegmental right lower lobe pulmonary embolus. Subsegmental left upper lobe pulmonary embolus. Right to left ventricular ratio of 1. Paradoxical bowing of the interventricular septum. Mitral annular calcifications at least moderate. No significant pericardial effusion. The thoracic aorta is normal in caliber. Mild atherosclerotic plaque of the thoracic aorta. No coronary artery calcifications. Mediastinum/Nodes: There is a 2 cm right hilar lymph node. No enlarged mediastinal or axillary lymph nodes. Thyroid gland, trachea, and esophagus demonstrate no significant findings. Small hiatal hernia. Lungs/Pleura: Patchy airspace opacity of the right lower lobe as well as interval development of right base peripheral wedge-shaped airspace opacity. Trace right pleural effusion. No left pleural effusion. Pulmonary micronodule within the right middle lobe. 5 mm pulmonary nodule within the right lower lobe (6:78). No pulmonary mass. Upper Abdomen: No acute abnormality. Musculoskeletal: No chest wall abnormality. No suspicious lytic or blastic osseous lesions. No acute displaced fracture. Multilevel degenerative changes of the spine. Review of the MIP images confirms the above findings. IMPRESSION: 1. Right lower lobe segmental and subsegmental pulmonary  embolus with likely developing right lower lobe pulmonary infarction. Subsegmental left upper lobe pulmonary embolus. Findings suggestive of associated right heart strain. 2. Trace right pleural effusion with patchy airspace opacities suggestive of infection/inflammation. Likely reactive 2 cm right hilar lymph node. Followup PA and lateral chest X-ray is recommended in 3-4 weeks following therapy to ensure resolution and exclude underlying malignancy. 3. A 5 mm right lower lobe pulmonary nodule. No follow-up needed if patient is low-risk. Non-contrast chest CT can be considered in 12 months if patient is high-risk. This recommendation follows the consensus statement: Guidelines for Management of Incidental Pulmonary Nodules Detected on CT Images: From the Fleischner Society 2017; Radiology 2017; 284:228-243. 4. Small hiatal hernia. 5. Aortic Atherosclerosis (ICD10-I70.0) including mitral annular calcifications. These results were called by telephone at the time of interpretation on 02/14/2021 at 2:35am to provider Dr. PWaverly Ferrari who verbally acknowledged these results. Electronically Signed   By: MIven FinnM.D.   On: 02/14/2021 02:48   [place]domen Limited  Result Date: 02/13/2021 CLINICAL DATA:  Right upper quadrant abdominal pain EXAM: ULTRASOUND ABDOMEN LIMITED RIGHT UPPER QUADRANT COMPARISON:  None. FINDINGS: Gallbladder: No gallstones or wall thickening visualized. No sonographic Murphy sign noted by sonographer. Common bile duct: Diameter: 3 mm in proximal diameter Liver: Hepatic parenchymal echogenicity and echotexture is normal. There is a 14 x 10 x 16 mm hyperechoic mass within the left hepatic lobe (image # 23) most compatible with a hemangioma in a patient without a history of malignancy. No other focal intrahepatic masses are identified. There is no intrahepatic biliary ductal dilation. Portal vein is patent on color Doppler imaging with normal direction of blood flow towards the liver. Other: None.  IMPRESSION: No acute abnormality. 16 mm hyperechoic mass within the left hepatic lobe, likely representing a hemangioma in a patient without a history of malignancy. Electronically Signed   By: AFidela SalisburyMD   On: 02/13/2021 23:56   VAS [place]OWER EXTREMITY VENOUS (DVT)  Result Date: 02/14/2021  Lower Venous DVT Study Patient Name:  GVALARY CASAD Date of Exam:   02/14/2021 Medical Rec #: [id number]     Accession #:    [id number] of Birth: [birth year]     Patient Gender: F Patient Age:   12YExam  [facility] Procedure:      VAS [place] LOWER EXTREMITY VENOUS (DVT) Referring Phys: [place] --------------------------------------------------------------------------------  Indications: Pulmonary embolism.  Comparison Study: no prior Performing Technologist: Archie Patten RVS  Examination Guidelines: A complete evaluation includes B-mode imaging, spectral Doppler, color Doppler, and power Doppler as needed of all accessible portions of each vessel. Bilateral testing is considered an integral part of a complete examination. Limited examinations for reoccurring indications may be performed as noted. The reflux portion of the exam is performed with the patient in reverse Trendelenburg.  +---------+---------------+---------+-----------+----------+-------------------+ RIGHT    CompressibilityPhasicitySpontaneityPropertiesThrombus Aging      +---------+---------------+---------+-----------+----------+-------------------+ CFV      Full           Yes      Yes                                      +---------+---------------+---------+-----------+----------+-------------------+ SFJ      Full                                                             +---------+---------------+---------+-----------+----------+-------------------+ FV Prox  Full                                                              +---------+---------------+---------+-----------+----------+-------------------+ FV Mid   Full                                                             +---------+---------------+---------+-----------+----------+-------------------+ FV DistalFull                                                             +---------+---------------+---------+-----------+----------+-------------------+ PFV      Full                                                             +---------+---------------+---------+-----------+----------+-------------------+ POP      Full           Yes      Yes                                      +---------+---------------+---------+-----------+----------+-------------------+ PTV      Full                                                             +---------+---------------+---------+-----------+----------+-------------------+  PERO                                                  Not well visualized +---------+---------------+---------+-----------+----------+-------------------+   +---------+---------------+---------+-----------+----------+--------------+ LEFT     CompressibilityPhasicitySpontaneityPropertiesThrombus Aging +---------+---------------+---------+-----------+----------+--------------+ CFV      Full           Yes      Yes                                 +---------+---------------+---------+-----------+----------+--------------+ SFJ      Full                                                        +---------+---------------+---------+-----------+----------+--------------+ FV Prox  Full                                                        +---------+---------------+---------+-----------+----------+--------------+ FV Mid   Full                                                        +---------+---------------+---------+-----------+----------+--------------+ FV DistalFull                                                         +---------+---------------+---------+-----------+----------+--------------+ PFV      Full                                                        +---------+---------------+---------+-----------+----------+--------------+ POP      Full           Yes      Yes                                 +---------+---------------+---------+-----------+----------+--------------+ PTV      Full                                                        +---------+---------------+---------+-----------+----------+--------------+ PERO     Full                                                        +---------+---------------+---------+-----------+----------+--------------+  Summary: BILATERAL: - No evidence of deep vein thrombosis seen in the lower extremities, bilaterally. -No evidence of popliteal cyst, bilaterally.   *See table(s) above for measurements and observations. Electronically signed by Deitra Mayo MD on 02/14/2021 at 4:58:25 PM.    Final      LOS: 1 day   [name], MD [facility]  02/15/2021, 12:12 PM

## 2021-02-15 NOTE — Plan of Care (Signed)
  Problem: Health Behavior/Discharge Planning: Goal: Ability to manage health-related needs will improve Outcome: Progressing   Problem: Clinical Measurements: Goal: Respiratory complications will improve Outcome: Progressing   

## 2021-02-16 ENCOUNTER — Other Ambulatory Visit (HOSPITAL_COMMUNITY): Payer: Self-pay

## 2021-02-16 LAB — CBC WITH DIFFERENTIAL/PLATELET
Abs Immature Granulocytes: 0.04 10*3/uL (ref 0.00–0.07)
Basophils Absolute: 0 10*3/uL (ref 0.0–0.1)
Basophils Relative: 0 %
Eosinophils Absolute: 0.3 10*3/uL (ref 0.0–0.5)
Eosinophils Relative: 3 %
HCT: 35.2 % — ABNORMAL LOW (ref 36.0–46.0)
Hemoglobin: 11 g/dL — ABNORMAL LOW (ref 12.0–15.0)
Immature Granulocytes: 0 %
Lymphocytes Relative: 22 %
Lymphs Abs: 2.1 10*3/uL (ref 0.7–4.0)
MCH: 24.8 pg — ABNORMAL LOW (ref 26.0–34.0)
MCHC: 31.3 g/dL (ref 30.0–36.0)
MCV: 79.3 fL — ABNORMAL LOW (ref 80.0–100.0)
Monocytes Absolute: 1 10*3/uL (ref 0.1–1.0)
Monocytes Relative: 10 %
Neutro Abs: 6.3 10*3/uL (ref 1.7–7.7)
Neutrophils Relative %: 65 %
Platelets: 188 10*3/uL (ref 150–400)
RBC: 4.44 MIL/uL (ref 3.87–5.11)
RDW: 14.1 % (ref 11.5–15.5)
WBC: 9.8 10*3/uL (ref 4.0–10.5)
nRBC: 0 % (ref 0.0–0.2)

## 2021-02-16 LAB — BASIC METABOLIC PANEL
Anion gap: 10 (ref 5–15)
BUN: 23 mg/dL (ref 8–23)
CO2: 24 mmol/L (ref 22–32)
Calcium: 8.7 mg/dL — ABNORMAL LOW (ref 8.9–10.3)
Chloride: 99 mmol/L (ref 98–111)
Creatinine, Ser: 1.1 mg/dL — ABNORMAL HIGH (ref 0.44–1.00)
GFR, Estimated: 52 mL/min — ABNORMAL LOW (ref >60)
Glucose, Bld: 110 mg/dL — ABNORMAL HIGH (ref 70–99)
Potassium: 3.9 mmol/L (ref 3.5–5.1)
Sodium: 133 mmol/L — ABNORMAL LOW (ref 135–145)

## 2021-02-16 LAB — HEPARIN LEVEL (UNFRACTIONATED): Heparin Unfractionated: 0.45 IU/mL (ref 0.30–0.70)

## 2021-02-16 LAB — MAGNESIUM: Magnesium: 2.2 mg/dL (ref 1.7–2.4)

## 2021-02-16 MED ORDER — APIXABAN 5 MG PO TABS
10.0000 mg | ORAL_TABLET | Freq: Two times a day (BID) | ORAL | Status: DC
  Administered 2021-02-16 – 2021-02-17 (×3): 10 mg via ORAL
  Filled 2021-02-16 (×4): qty 2

## 2021-02-16 MED ORDER — APIXABAN 5 MG PO TABS
ORAL_TABLET | ORAL | 2 refills | Status: DC
  Filled 2021-02-16: qty 74, 30d supply, fill #0

## 2021-02-16 MED ORDER — APIXABAN 5 MG PO TABS: 5.0000 mg | ORAL_TABLET | Freq: Two times a day (BID) | ORAL | Status: DC

## 2021-02-16 MED ORDER — DILTIAZEM HCL 60 MG PO TABS
30.0000 mg | ORAL_TABLET | Freq: Four times a day (QID) | ORAL | Status: DC
  Administered 2021-02-16: 30 mg via ORAL
  Filled 2021-02-16 (×2): qty 1

## 2021-02-16 MED ORDER — DILTIAZEM HCL ER COATED BEADS 120 MG PO CP24
120.0000 mg | ORAL_CAPSULE | Freq: Every day | ORAL | Status: DC
  Administered 2021-02-17: 120 mg via ORAL
  Filled 2021-02-16: qty 1

## 2021-02-16 MED ORDER — DILTIAZEM HCL 60 MG PO TABS
30.0000 mg | ORAL_TABLET | Freq: Four times a day (QID) | ORAL | Status: AC
  Administered 2021-02-16 – 2021-02-17 (×2): 30 mg via ORAL
  Filled 2021-02-16 (×2): qty 1

## 2021-02-16 NOTE — TOC Benefit Eligibility Note (Signed)
Transition of Care [facility]) Benefit Eligibility Note    Patient Details  Name: Inderpreet Ridens MRN: [id number] Date of Birth: 12-Nov-1945   Medication/Dose: ELIQUIS  2.5 MG BID  : CO-PAY- $20.00    and  ELIQUIS  5 MG BID : CO-PAY- $20.00  Covered?: Yes  Tier: 3 Drug  Prescription Coverage [facility]: [facility]  Spoke with Person/Company/Phone Number:: LATRENA  @  [employer] #  [id number]  Co-Pay: $20.00  Prior Approval: No  Deductible:  (NO DEDUCTIBLE WITH PLAN / OUT-OF-POCKET:UNMET)  Additional Notes: APIXABAN  and  ELIQUIS  10 MG BID : NON-FORMULARY    Memory Argue Phone Number: 02/16/2021, 12:58 PM

## 2021-02-16 NOTE — Care Management (Signed)
1151 02-16-21 Case Manager received a consult for Eliquis co pay. Case Manager submitted benefits check for Eliquis. Case Manager will follow for cost.

## 2021-02-16 NOTE — Progress Notes (Signed)
PROGRESS NOTE    Helen Stanton  [id number] DOB: October 10, 1945 DOA: 02/13/2021 PCP: Seward Carol, MD   Chief Complaint  Patient presents with   Abdominal Pain   Brief Narrative: 75 year old female with hypertension asthma presented with right upper quadrant abdominal pain right lower chest pain started "Sunday, pleuritic in nature.  No prior episode DVT PE or recent travel or surgeries. ED CT was done that showed a right lower lobe segmental and subsegmental PE development of right lower lobe pulmonary infarct.  5 mm right lower lobe pulmonary nodule patient was placed on heparin drip and admitted.  Also found A. fib with RVR.  Patient was in normal sinus rhythm since receiving Cardizem in the ED however patient flipped back to A. fib 8/3 afternoon and placed back on Cardizem  Subjective: Resting comfortably mild chest pain, on room air.  She is back in normal sinus rhythm.  Assessment & Plan:  Right lower lobe segmental and subsegmental PE Development of right lower lobe pulmonary infarct: Pharmacy consulted to transition to DOAC . CM to check copay. Echo shows EF 60-65% no WMA, G1 DD, normal RV systolic function and size with normal pulmonary artery systolic pressure mild to moderate MR.  DVT likely unprovoked no recent travel surgeries no known history of cancer.  PAF with RVR:Patient was in NSR since receiving Cardizem in the ED however patient flipped back to A. Fib 8/3 afternoon and placed back on Cardizem, now in NSR-we will change to p.o. Cardizem cna consolidate on Cardizem CD in am. stop lisinopril.  Echocardiogram reviewed and normal, TSH 4.5.  Suspect this in the setting of PE.  Outpatient cardiology follow-up.  Will monitor overnight on telemetry if she stays in sinus rhythm can do outpatient cardio follow-up. .chad   Elevated troponin/likley demand mismatch 2/2 PE/ A. fib with RVR/tachycardia. No WWA in echo.  5 mm right lower lobe pulmonary nodule- need f/u CT  HTN-on  hctz/lisinipril-hold lisinopril for now.  Continue HCTZ now on Cardizem on discharge may not need HCTZ or lisinopril or both.  Asthma-signs of exacerbation, on dulera continue the same.  Morbid obesity with BMI 35: Patient will benefit with weight loss, PCP follow-up and healthy lifestyle. She will benefit with OSA evaluation  Diet Order             Diet regular Room service appropriate? Yes; Fluid consistency: Thin  Diet effective now                 Patient's Body mass index is 35.71 kg/m. DVT prophylaxis: heparin gtt Code Status:   Code Status: Full Code  Family Communication: plan of care discussed with patient at bedside.  Status is: Inpatient Remains inpatient appropriate because:IV treatments appropriate due to intensity of illness or inability to take PO and Inpatient level of care appropriate due to severity of illness Dispo: The patient is from: Home              Anticipated d/c is to: Home likely tomorrow              Patient currently is not medically stable to d/c.   Difficult to place patient No Unresulted Labs (From admission, onward)     Start     Ordered   02/15/21 0500  Basic metabolic panel  Daily,   R      02/14/21 1506   02/15/21 0500  CBC with Differential/Platelet  Daily,   R      08"$ /02/22 1506  02/15/21 0500  Magnesium  Daily,   R      02/14/21 1506           Medications reviewed:  Scheduled Meds:  AeroChamber Plus Flo-Vu Large  1 each Other Once   apixaban  10 mg Oral BID   Followed by   Derrill Memo ON 02/23/2021] apixaban  5 mg Oral BID   aspirin EC  81 mg Oral Daily   diltiazem  30 mg Oral Q6H   hydrochlorothiazide  25 mg Oral Daily   mometasone-formoterol  2 puff Inhalation BID   montelukast  10 mg Oral Daily   multivitamin with minerals  1 tablet Oral Daily   sodium chloride flush  3 mL Intravenous Q12H   Continuous Infusions:  diltiazem (CARDIZEM) infusion 10 mg/hr (02/16/21 0708)   Consultants:see note  Procedures:see  note Antimicrobials: Anti-infectives (From admission, onward)    None      Culture/Microbiology No results found for: SDES, SPECREQUEST, CULT, REPTSTATUS  Other culture-see note  Objective: Vitals: Today's Vitals   02/16/21 0300 02/16/21 0400 02/16/21 0500 02/16/21 0748  BP: (!) 125/53 (!) 115/55 (!) 121/58 (!) 141/59  Pulse: 64 70 68 68  Resp: '16 15 18 18  '$ Temp: 98.1 F (36.7 C)   98.8 F (37.1 C)  TempSrc: Oral   Oral  SpO2: 100% 100% 99% 100%  Weight:      Height:      PainSc:        Intake/Output Summary (Last 24 hours) at 02/16/2021 1058 Last data filed at 02/16/2021 0708 Gross per 24 hour  Intake 505.53 ml  Output 700 ml  Net -194.47 ml    Filed Weights   02/14/21 0303 02/15/21 0138  Weight: 85.7 kg 85.7 kg   Weight change:   Intake/Output from previous day: 08/03 0701 - 08/04 0700 In: 336.8 [I.V.:336.8] Out: 700 [Urine:700] Intake/Output this shift: Total I/O In: 168.7 [I.V.:168.7] Out: -  Filed Weights   02/14/21 0303 02/15/21 0138  Weight: 85.7 kg 85.7 kg   Examination: General exam: AAOx 3older than stated age, weak appearing. HEENT:Oral mucosa moist, Ear/Nose WNL grossly, dentition normal. Respiratory system: bilaterally diminished, no use of accessory muscle Cardiovascular system: S1 & S2 +, No JVD,. Gastrointestinal system: Abdomen soft,NT,ND, BS+ Nervous System:Alert, awake, moving extremities and grossly nonfocal Extremities: no edema, distal peripheral pulses palpable.  Skin: No rashes,no icterus. MSK: Normal muscle bulk,tone, power.   Data Reviewed: I have personally reviewed following labs and imaging studies CBC: Recent Labs  Lab 02/13/21 2300 02/15/21 0022 02/16/21 0225  WBC 13.4* 11.2* 9.8  NEUTROABS 10.1* 7.8* 6.3  HGB 12.5 11.1* 11.0*  HCT 39.7 36.0 35.2*  MCV 78.5* 77.1* 79.3*  PLT 226 191 0000000    Basic Metabolic Panel: Recent Labs  Lab 02/13/21 2300 02/15/21 0022 02/16/21 0225  NA 134* 135 133*  K 3.8 4.0 3.9   CL 99 99 99  CO2 '25 26 24  '$ GLUCOSE 136* 106* 110*  BUN '13 17 23  '$ CREATININE 1.13* 1.15* 1.10*  CALCIUM 9.4 8.9 8.7*  MG  --  2.0 2.2    GFR: Estimated Creatinine Clearance: 42.6 mL/min (A) (by C-G formula based on SCr of 1.1 mg/dL (H)). Liver Function Tests: Recent Labs  Lab 02/13/21 2300  AST 26  ALT 23  ALKPHOS 75  BILITOT 1.1  PROT 7.1  ALBUMIN 3.1*    Recent Labs  Lab 02/13/21 2300  LIPASE 43    No results for input(s): AMMONIA in  the last 168 hours. Coagulation Profile: No results for input(s): INR, PROTIME in the last 168 hours. Cardiac Enzymes: No results for input(s): CKTOTAL, CKMB, CKMBINDEX, TROPONINI in the last 168 hours. BNP (last 3 results) No results for input(s): PROBNP in the last 8760 hours. HbA1C: Recent Labs    02/15/21 0022  HGBA1C 6.0*    CBG: No results for input(s): GLUCAP in the last 168 hours. Lipid Profile: Recent Labs    02/15/21 0022  CHOL 192  HDL 62  LDLCALC 112*  TRIG 89  CHOLHDL 3.1    Thyroid Function Tests: Recent Labs    02/15/21 0022  TSH 4.594*    Anemia Panel: No results for input(s): VITAMINB12, FOLATE, FERRITIN, TIBC, IRON, RETICCTPCT in the last 72 hours. Sepsis Labs: No results for input(s): PROCALCITON, LATICACIDVEN in the last 168 hours.  Recent Results (from the past 240 hour(s))  Resp Panel by RT-PCR (Flu A&B, Covid) Nasopharyngeal Swab     Status: None   Collection Time: 02/14/21  1:30 AM   Specimen: Nasopharyngeal Swab; Nasopharyngeal(NP) swabs in vial transport medium  Result Value Ref Range Status   SARS Coronavirus 2 by RT PCR NEGATIVE NEGATIVE Final    Comment: (NOTE) SARS-CoV-2 target nucleic acids are NOT DETECTED.  The SARS-CoV-2 RNA is generally detectable in upper respiratory specimens during the acute phase of infection. The lowest concentration of SARS-CoV-2 viral copies this assay can detect is 138 copies/mL. A negative result does not preclude SARS-Cov-2 infection and  should not be used as the sole basis for treatment or other patient management decisions. A negative result may occur with  improper specimen collection/handling, submission of specimen other than nasopharyngeal swab, presence of viral mutation(s) within the areas targeted by this assay, and inadequate number of viral copies(<138 copies/mL). A negative result must be combined with clinical observations, patient history, and epidemiological information. The expected result is Negative.  Fact Sheet for Patients:  EntrepreneurPulse.com.au  Fact Sheet for Healthcare Providers:  IncredibleEmployment.be  This test is no t yet approved or cleared by the [place] FDA and  has been authorized for detection and/or diagnosis of SARS-CoV-2 by FDA under an Emergency Use Authorization (EUA). This EUA will remain  in effect (meaning this test can be used) for the duration of the COVID-19 declaration under Section 564(b)(1) of the Act, 21 U.S.C.section 360bbb-3(b)(1), unless the authorization is terminated  or revoked sooner.       Influenza A by PCR NEGATIVE NEGATIVE Final   Influenza B by PCR NEGATIVE NEGATIVE Final    Comment: (NOTE) The Xpert Xpress SARS-CoV-2/FLU/RSV plus assay is intended as an aid in the diagnosis of influenza from Nasopharyngeal swab specimens and should not be used as a sole basis for treatment. Nasal washings and aspirates are unacceptable for Xpert Xpress SARS-CoV-2/FLU/RSV testing.  Fact Sheet for Patients: EntrepreneurPulse.com.au  Fact Sheet for Healthcare Providers: IncredibleEmployment.be  This test is not yet approved or cleared by the [place] FDA and has been authorized for detection and/or diagnosis of SARS-CoV-2 by FDA under an Emergency Use Authorization (EUA). This EUA will remain in effect (meaning this test can be used) for the duration of the COVID-19 declaration  under Section 564(b)(1) of the Act, 21 U.S.C. section 360bbb-3(b)(1), unless the authorization is terminated or revoked.  Performed at [facility], [place] [address] [place], [place] [address]       Radiology Studies: ECHOCARDIOGRAM COMPLETE  Result Date: 02/15/2021    ECHOCARDIOGRAM REPORT   Patient  Name:   Helen Stanton Date of Exam: 02/15/2021 Medical Rec #:  [id number]     Height:       61.0 in Accession #:    [id number]    Weight:       189.0 lb Date of Birth:  September 04, 1945     BSA:          1.844 m Patient Age:    26 years      BP:           140/57 mmHg Patient Gender: F             HR:           72 bpm. Exam Location:  Inpatient Procedure: 2D Echo, Color Doppler and Cardiac Doppler Indications:    I48.91* Unspeicified atrial fibrillation  History:        Patient has no prior history of Echocardiogram examinations.                 Arrythmias:Atrial Fibrillation; Risk Factors:Hypertension.  Sonographer:    Bernadene Person RDCS Referring Phys: [place]  1. Left ventricular ejection fraction, by estimation, is 60 to 65%. The left ventricle has normal function. The left ventricle has no regional wall motion abnormalities. Left ventricular diastolic parameters are consistent with Grade I diastolic dysfunction (impaired relaxation).  2. Right ventricular systolic function is normal. The right ventricular size is normal. There is normal pulmonary artery systolic pressure.  3. The mitral valve is normal in structure. Mild to moderate mitral valve regurgitation. No evidence of mitral stenosis.  4. The aortic valve is normal in structure. Aortic valve regurgitation is not visualized. No aortic stenosis is present.  5. The inferior vena cava is normal in size with greater than 50% respiratory variability, suggesting right atrial pressure of 3 mmHg. FINDINGS  Left Ventricle: Left ventricular ejection fraction, by estimation, is 60 to 65%. The left ventricle has normal function. The left  ventricle has no regional wall motion abnormalities. The left ventricular internal cavity size was normal in size. There is  no left ventricular hypertrophy. Left ventricular diastolic parameters are consistent with Grade I diastolic dysfunction (impaired relaxation). Right Ventricle: The right ventricular size is normal. No increase in right ventricular wall thickness. Right ventricular systolic function is normal. There is normal pulmonary artery systolic pressure. The tricuspid regurgitant velocity is 2.45 m/s, and  with an assumed right atrial pressure of 3 mmHg, the estimated right ventricular systolic pressure is 123456 mmHg. Left Atrium: Left atrial size was normal in size. Right Atrium: Right atrial size was normal in size. Pericardium: There is no evidence of pericardial effusion. Mitral Valve: The mitral valve is normal in structure. There is moderate thickening of the mitral valve leaflet(s). Mild to moderate mitral valve regurgitation. No evidence of mitral valve stenosis. Tricuspid Valve: The tricuspid valve is normal in structure. Tricuspid valve regurgitation is trivial. No evidence of tricuspid stenosis. Aortic Valve: The aortic valve is normal in structure. Aortic valve regurgitation is not visualized. No aortic stenosis is present. Pulmonic Valve: The pulmonic valve was normal in structure. Pulmonic valve regurgitation is not visualized. No evidence of pulmonic stenosis. Aorta: The aortic root is normal in size and structure. Venous: The inferior vena cava is normal in size with greater than 50% respiratory variability, suggesting right atrial pressure of 3 mmHg. IAS/Shunts: No atrial level shunt detected by color flow Doppler.  LEFT VENTRICLE PLAX 2D LVIDd:  3.90 cm LVIDs:         2.70 cm LV PW:         0.90 cm LV IVS:        1.10 cm LVOT diam:     1.80 cm LV SV:         39 LV SV Index:   21 LVOT Area:     2.54 cm  RIGHT VENTRICLE TAPSE (M-mode): 1.7 cm LEFT ATRIUM           Index        RIGHT ATRIUM           Index LA diam:      3.20 cm 1.74 cm/m  RA Area:     10.50 cm LA Vol (A2C): 35.4 ml 19.20 ml/m RA Volume:   19.40 ml  10.52 ml/m LA Vol (A4C): 35.5 ml 19.25 ml/m  AORTIC VALVE LVOT Vmax:   98.53 cm/s LVOT Vmean:  68.533 cm/s LVOT VTI:    0.154 m  AORTA Ao Root diam: 2.90 cm Ao Asc diam:  2.90 cm MR PISA:        1.01 cm TRICUSPID VALVE MR PISA Radius: 0.40 cm  TR Peak grad:   24.0 mmHg                          TR Vmax:        245.00 cm/s                           SHUNTS                          Systemic VTI:  0.15 m                          Systemic Diam: 1.80 cm Candee Furbish MD Electronically signed by Candee Furbish MD Signature Date/Time: 02/15/2021/2:36:42 PM    Final    VAS [place] LOWER EXTREMITY VENOUS (DVT)  Result Date: 02/14/2021  Lower Venous DVT Study Patient Name:  Helen Stanton  Date of Exam:   02/14/2021 Medical Rec #: [id number]      Accession #:    [id number] Date of Birth: [birth year]      Patient Gender: F Patient Age:   43Y Exam Location:  [facility] Procedure:      VAS [place] LOWER EXTREMITY VENOUS (DVT) Referring Phys: [place] --------------------------------------------------------------------------------  Indications: Pulmonary embolism.  Comparison Study: no prior Performing Technologist: Archie Patten RVS  Examination Guidelines: A complete evaluation includes B-mode imaging, spectral Doppler, color Doppler, and power Doppler as needed of all accessible portions of each vessel. Bilateral testing is considered an integral part of a complete examination. Limited examinations for reoccurring indications may be performed as noted. The reflux portion of the exam is performed with the patient in reverse Trendelenburg.  +---------+---------------+---------+-----------+----------+-------------------+ RIGHT    CompressibilityPhasicitySpontaneityPropertiesThrombus Aging      +---------+---------------+---------+-----------+----------+-------------------+ CFV       Full           Yes      Yes                                      +---------+---------------+---------+-----------+----------+-------------------+ SFJ      Full                                                             +---------+---------------+---------+-----------+----------+-------------------+  FV Prox  Full                                                             +---------+---------------+---------+-----------+----------+-------------------+ FV Mid   Full                                                             +---------+---------------+---------+-----------+----------+-------------------+ FV DistalFull                                                             +---------+---------------+---------+-----------+----------+-------------------+ PFV      Full                                                             +---------+---------------+---------+-----------+----------+-------------------+ POP      Full           Yes      Yes                                      +---------+---------------+---------+-----------+----------+-------------------+ PTV      Full                                                             +---------+---------------+---------+-----------+----------+-------------------+ PERO                                                  Not well visualized +---------+---------------+---------+-----------+----------+-------------------+   +---------+---------------+---------+-----------+----------+--------------+ LEFT     CompressibilityPhasicitySpontaneityPropertiesThrombus Aging +---------+---------------+---------+-----------+----------+--------------+ CFV      Full           Yes      Yes                                 +---------+---------------+---------+-----------+----------+--------------+ SFJ      Full                                                         +---------+---------------+---------+-----------+----------+--------------+ FV Prox  Full                                                        +---------+---------------+---------+-----------+----------+--------------+  FV Mid   Full                                                        +---------+---------------+---------+-----------+----------+--------------+ FV DistalFull                                                        +---------+---------------+---------+-----------+----------+--------------+ PFV      Full                                                        +---------+---------------+---------+-----------+----------+--------------+ POP      Full           Yes      Yes                                 +---------+---------------+---------+-----------+----------+--------------+ PTV      Full                                                        +---------+---------------+---------+-----------+----------+--------------+ PERO     Full                                                        +---------+---------------+---------+-----------+----------+--------------+     Summary: BILATERAL: - No evidence of deep vein thrombosis seen in the lower extremities, bilaterally. -No evidence of popliteal cyst, bilaterally.   *See table(s) above for measurements and observations. Electronically signed by Deitra Mayo MD on 02/14/2021 at 4:58:25 PM.    Final      LOS: 2 days   [name], MD [facility]  02/16/2021, 10:58 AM

## 2021-02-16 NOTE — Discharge Instructions (Signed)
Information on my medicine - ELIQUIS (apixaban)  This medication education was reviewed with me or my healthcare representative as part of my discharge preparation.  The pharmacist that spoke with me during my hospital stay was:    Why was Eliquis prescribed for you? Eliquis was prescribed to treat blood clots that may have been found in the veins of your legs (deep vein thrombosis) or in your lungs (pulmonary embolism) and to reduce the risk of them occurring again.  What do You need to know about Eliquis ? The starting dose is 10 mg (two 5 mg tablets) taken TWICE daily for the FIRST SEVEN (7) DAYS, then on (enter date)  02/23/21  the dose is reduced to ONE 5 mg tablet taken TWICE daily.  Eliquis may be taken with or without food.   Try to take the dose about the same time in the morning and in the evening. If you have difficulty swallowing the tablet whole please discuss with your pharmacist how to take the medication safely.  Take Eliquis exactly as prescribed and DO NOT stop taking Eliquis without talking to the doctor who prescribed the medication.  Stopping may increase your risk of developing a new blood clot.  Refill your prescription before you run out.  After discharge, you should have regular check-up appointments with your healthcare provider that is prescribing your Eliquis.    What do you do if you miss a dose? If a dose of ELIQUIS is not taken at the scheduled time, take it as soon as possible on the same day and twice-daily administration should be resumed. The dose should not be doubled to make up for a missed dose.  Important Safety Information A possible side effect of Eliquis is bleeding. You should call your healthcare provider right away if you experience any of the following: Bleeding from an injury or your nose that does not stop. Unusual colored urine (red or dark brown) or unusual colored stools (red or black). Unusual bruising for unknown reasons. A  serious fall or if you hit your head (even if there is no bleeding).  Some medicines may interact with Eliquis and might increase your risk of bleeding or clotting while on Eliquis. To help avoid this, consult your healthcare provider or pharmacist prior to using any new prescription or non-prescription medications, including herbals, vitamins, non-steroidal anti-inflammatory drugs (NSAIDs) and supplements.  This website has more information on Eliquis (apixaban): http://www.eliquis.com/eliquis/home

## 2021-02-16 NOTE — Progress Notes (Signed)
ANTICOAGULATION CONSULT NOTE   Pharmacy Consult for Heparin>>apixaban Indication: pulmonary embolus  Allergies  Allergen Reactions   Fish Allergy Shortness Of Breath   Milk-Related Compounds Shortness Of Breath     Vital Signs: Temp: 98.1 F (36.7 C) (08/04 0300) Temp Source: Oral (08/04 0300) BP: 121/58 (08/04 0500) Pulse Rate: 68 (08/04 0500)  Labs: Recent Labs    02/13/21 2300 02/14/21 0054 02/14/21 1218 02/15/21 0022 02/15/21 1056 02/16/21 0225  HGB 12.5  --   --  11.1*  --  11.0*  HCT 39.7  --   --  36.0  --  35.2*  PLT 226  --   --  191  --  188  HEPARINUNFRC  --   --    < > 0.28* 0.61 0.45  CREATININE 1.13*  --   --  1.15*  --  1.10*  TROPONINIHS 30* 33*  --   --   --   --    < > = values in this interval not displayed.     Estimated Creatinine Clearance: 42.6 mL/min (A) (by C-G formula based on SCr of 1.1 mg/dL (H)).   Medical History: Past Medical History:  Diagnosis Date   Asthma    HTN (hypertension) 01/08/2012   Hypertension      Assessment: 75 y/o F with right upper quadrant pain found to have new onset pulmonary embolus per CT Angio (dopplers negative).   -apixaban cost is $20 per month  Transition to apixaban today.   Goal of Therapy:   Monitor platelets by anticoagulation protocol: Yes   Plan:  Dc heparin Apixaban '10mg'$  PO BID x7 days then '5mg'$  BID Rx will follow peripherally  Onnie Boer, PharmD, BCIDP, AAHIVP, CPP Infectious Disease Pharmacist 02/16/2021 7:38 AM

## 2021-02-17 ENCOUNTER — Other Ambulatory Visit (HOSPITAL_COMMUNITY): Payer: Self-pay

## 2021-02-17 DIAGNOSIS — J45909 Unspecified asthma, uncomplicated: Secondary | ICD-10-CM

## 2021-02-17 LAB — CBC WITH DIFFERENTIAL/PLATELET
Abs Immature Granulocytes: 0.03 10*3/uL (ref 0.00–0.07)
Basophils Absolute: 0 10*3/uL (ref 0.0–0.1)
Basophils Relative: 0 %
Eosinophils Absolute: 0.3 10*3/uL (ref 0.0–0.5)
Eosinophils Relative: 3 %
HCT: 32.6 % — ABNORMAL LOW (ref 36.0–46.0)
Hemoglobin: 10.4 g/dL — ABNORMAL LOW (ref 12.0–15.0)
Immature Granulocytes: 0 %
Lymphocytes Relative: 19 %
Lymphs Abs: 1.6 10*3/uL (ref 0.7–4.0)
MCH: 24.5 pg — ABNORMAL LOW (ref 26.0–34.0)
MCHC: 31.9 g/dL (ref 30.0–36.0)
MCV: 76.7 fL — ABNORMAL LOW (ref 80.0–100.0)
Monocytes Absolute: 0.9 10*3/uL (ref 0.1–1.0)
Monocytes Relative: 11 %
Neutro Abs: 5.9 10*3/uL (ref 1.7–7.7)
Neutrophils Relative %: 67 %
Platelets: 226 10*3/uL (ref 150–400)
RBC: 4.25 MIL/uL (ref 3.87–5.11)
RDW: 13.9 % (ref 11.5–15.5)
WBC: 8.8 10*3/uL (ref 4.0–10.5)
nRBC: 0 % (ref 0.0–0.2)

## 2021-02-17 LAB — MAGNESIUM: Magnesium: 2.3 mg/dL (ref 1.7–2.4)

## 2021-02-17 LAB — BASIC METABOLIC PANEL
Anion gap: 7 (ref 5–15)
BUN: 19 mg/dL (ref 8–23)
CO2: 29 mmol/L (ref 22–32)
Calcium: 8.8 mg/dL — ABNORMAL LOW (ref 8.9–10.3)
Chloride: 98 mmol/L (ref 98–111)
Creatinine, Ser: 1.25 mg/dL — ABNORMAL HIGH (ref 0.44–1.00)
GFR, Estimated: 44 mL/min — ABNORMAL LOW (ref >60)
Glucose, Bld: 127 mg/dL — ABNORMAL HIGH (ref 70–99)
Potassium: 4.5 mmol/L (ref 3.5–5.1)
Sodium: 134 mmol/L — ABNORMAL LOW (ref 135–145)

## 2021-02-17 MED ORDER — DILTIAZEM HCL ER COATED BEADS 120 MG PO CP24
120.0000 mg | ORAL_CAPSULE | Freq: Every day | ORAL | 2 refills | Status: DC
  Filled 2021-02-17: qty 30, 30d supply, fill #0

## 2021-02-17 MED ORDER — APIXABAN 5 MG PO TABS
5.0000 mg | ORAL_TABLET | Freq: Two times a day (BID) | ORAL | 2 refills | Status: DC
  Filled 2021-02-17: qty 60, 30d supply, fill #0

## 2021-02-17 NOTE — Discharge Summary (Signed)
Physician Discharge Summary [facility]    Patient: Helen Stanton                   Admit date: 02/13/2021   DOB: 1945/12/11             Discharge date:02/17/2021/7:54 AM [id number]                          PCP: Seward Carol, MD  Disposition: HOME  Recommendations for Outpatient Follow-up:   Follow up: With your cardiologist within 1 week -Follow-up with your PCP within 1-2 weeks -Needed repeated BMP to monitor electrolytes, creatinine function Instructed patient to monitor blood pressure, and heart rate closely, keep a log and presented to PCP and cardiologist for medication adjustment Continue anticoagulation with Eliquis as instructed Risk of side effects and bleeding was discussed with Eliquis in detail...   Discharge Condition: Stable   Code Status:   Code Status: Full Code  Diet recommendation: Cardiac diet   Discharge Diagnoses:    Active Problems:   HTN (hypertension)   Asthma   Atrial fibrillation with RVR (HCC)   Pulmonary embolism (HCC)   History of Present Illness/ Hospital Course Helen Stanton Summary:    75 year old female with hypertension asthma presented with right upper quadrant abdominal pain right lower chest pain started Sunday, pleuritic in nature.  No prior episode DVT PE or recent travel or surgeries. ED CT was done that showed a right lower lobe segmental and subsegmental PE development of right lower lobe pulmonary infarct.  5 mm right lower lobe pulmonary nodule patient was placed on heparin drip and admitted.  Also found A. fib with RVR.  Patient was in normal sinus rhythm since receiving Cardizem in the ED however patient flipped back to A. fib 8/3 afternoon and placed back on Cardizem   Subjective: The patient was seen and examined, remained comfortable, denies any chest pain shortness of breath, remained in sinus rhythm -----------------------------------------------------------------------------------------------------------------------      Right lower lobe segmental and subsegmental PE / Development of right lower lobe pulmonary infarct: -Initiated on Eliquis 02/16/2021 -Echo shows EF 60-65% no WMA, G1 DD, normal RV systolic function and size with normal pulmonary artery systolic pressure mild to moderate MR.   DVT likely unprovoked no recent travel surgeries no known history of cancer.   PAF with [id number] was in NSR since receiving Cardizem in the ED however patient flipped back to A. Fib 8/3 afternoon and placed back on Cardizem, now in NSR-we will change to p.o. Cardizem  CD in am. stop lisinopril and HCTZ, as blood pressure responding to Cardizem, also considering renal protection. Echocardiogram reviewed and normal, TSH 4.5.  Suspect this in the setting of PE.  Outpatient cardiology follow-up.    Elevated troponin/likley demand mismatch 2/2 PE/ A. fib with RVR/tachycardia. No WWA in echo.   5 mm right lower lobe pulmonary nodule- need f/u CT   HTN-discontinued hctz/lisinipril- and lisinopril-responding to Cardizem CD, BP stable  -Cardiologist and PCP may consider restarting lisinopril in the future date if creatinine remains stable  asthma-signs of exacerbation, on dulera continue the same.   Morbid obesity with BMI 35: Patient will benefit with weight loss, PCP follow-up and healthy lifestyle. She will benefit with OSA evaluation   Diet Order                  Diet regular Room service appropriate? Yes; Fluid consistency: Thin  Diet effective now  Patient's Body mass index is 35.71 kg/m. DVT prophylaxis: heparin gtt Code Status:   Code Status: Full Code     Discharge Instructions:   Discharge Instructions     (HEART FAILURE PATIENTS) Call MD:  Anytime you have any of the following symptoms: 1) 3 pound weight gain in 24 hours or 5 pounds in 1 week 2) shortness of breath, with or without a dry hacking cough 3) swelling in the hands, feet or stomach 4) if you have to sleep on extra  pillows at night in order to breathe.   Complete by: As directed    Activity as tolerated - No restrictions   Complete by: As directed    Call MD for:  difficulty breathing, headache or visual disturbances   Complete by: As directed    Call MD for:  persistant dizziness or light-headedness   Complete by: As directed    Call MD for:  persistant nausea and vomiting   Complete by: As directed    Call MD for:  redness, tenderness, or signs of infection (pain, swelling, redness, odor or green/yellow discharge around incision site)   Complete by: As directed    Call MD for:  severe uncontrolled pain   Complete by: As directed    Call MD for:  temperature >100.4   Complete by: As directed    Diet - low sodium heart healthy   Complete by: As directed    Discharge instructions   Complete by: As directed    Follow-up with cardiologist and your PCP within 1 week.... Current medication likely need to be adjusted Your blood pressure need to be monitored daily.  Would like to need a BMP in 1 week to monitor electrolytes and kidney function.   Increase activity slowly   Complete by: As directed         Medication List     STOP taking these medications    lisinopril-hydrochlorothiazide 20-25 MG tablet Commonly known as: ZESTORETIC       TAKE these medications    albuterol 108 (90 Base) MCG/ACT inhaler Commonly known as: VENTOLIN HFA Inhale 2 puffs into the lungs every 6 (six) hours as needed. For wheezing   aspirin EC 81 MG tablet Take 81 mg by mouth daily.   calcium carbonate 1250 (500 Ca) MG tablet Commonly known as: OS-CAL - dosed in mg of elemental calcium Take 1 tablet by mouth 3 (three) times daily.   cholecalciferol 1000 units tablet Commonly known as: VITAMIN D Take 1,000 Units by mouth daily.   diltiazem 120 MG 24 hr capsule Commonly known as: CARDIZEM CD Take 1 capsule (120 mg total) by mouth daily.   Eliquis 5 MG Tabs tablet Generic drug: apixaban Take 2  tablets twice daily until 02/22/21 then reduce to 1 tablet twice daily   apixaban 5 MG Tabs tablet Commonly known as: ELIQUIS Take 1 tablet (5 mg total) by mouth 2 (two) times daily. Start taking on: February 23, 2021   Fluticasone-Salmeterol 250-50 MCG/DOSE Aepb Commonly known as: ADVAIR Inhale 1 puff into the lungs every 12 (twelve) hours.   montelukast 10 MG tablet Commonly known as: SINGULAIR Take 1 tablet by mouth daily. Take 1 tablet by mouth once daily   multivitamin with minerals Tabs tablet Take 1 tablet by mouth daily.   PREVAGEN PO Take 1 tablet by mouth daily. Take 1 tablet by mouth once daily        Allergies  Allergen Reactions   Fish Allergy Shortness  Of Breath   Milk-Related Compounds Shortness Of Breath     Procedures /Studies:   DG Chest 2 View  Result Date: 02/13/2021 CLINICAL DATA:  Right upper quadrant abdominal pain, short of breath, chest pain EXAM: CHEST - 2 VIEW COMPARISON:  12/22/2012 FINDINGS: Frontal and lateral views of the chest demonstrate an unremarkable cardiac silhouette. There is a small right pleural effusion, with minimal right basilar consolidation. No pneumothorax. Left chest is clear. No acute bony abnormalities. IMPRESSION: 1. Small right pleural effusion with right basilar consolidation. Given clinical history of chest pain, pulmonary embolus could give this appearance. Underlying pneumonia or atelectasis could also be considered. Electronically Signed   By: Randa Ngo M.D.   On: 02/13/2021 23:22   CT Angio Chest PE W/Cm &/Or Wo Cm  Result Date: 02/14/2021 CLINICAL DATA:  RUQ pain, goes up to R shoulder. Worse w movement, began approx 8pm 7/31. EXAM: CT ANGIOGRAPHY CHEST WITH CONTRAST TECHNIQUE: Multidetector CT imaging of the chest was performed using the standard protocol during bolus administration of intravenous contrast. Multiplanar CT image reconstructions and MIPs were obtained to evaluate the vascular anatomy. CONTRAST:  80m  OMNIPAQUE IOHEXOL 350 MG/ML SOLN COMPARISON:  Chest x-ray 02/14/2021 FINDINGS: Cardiovascular: Satisfactory opacification of the pulmonary arteries to the segmental level. Segmental and subsegmental right lower lobe pulmonary embolus. Subsegmental left upper lobe pulmonary embolus. Right to left ventricular ratio of 1. Paradoxical bowing of the interventricular septum. Mitral annular calcifications at least moderate. No significant pericardial effusion. The thoracic aorta is normal in caliber. Mild atherosclerotic plaque of the thoracic aorta. No coronary artery calcifications. Mediastinum/Nodes: There is a 2 cm right hilar lymph node. No enlarged mediastinal or axillary lymph nodes. Thyroid gland, trachea, and esophagus demonstrate no significant findings. Small hiatal hernia. Lungs/Pleura: Patchy airspace opacity of the right lower lobe as well as interval development of right base peripheral wedge-shaped airspace opacity. Trace right pleural effusion. No left pleural effusion. Pulmonary micronodule within the right middle lobe. 5 mm pulmonary nodule within the right lower lobe (6:78). No pulmonary mass. Upper Abdomen: No acute abnormality. Musculoskeletal: No chest wall abnormality. No suspicious lytic or blastic osseous lesions. No acute displaced fracture. Multilevel degenerative changes of the spine. Review of the MIP images confirms the above findings. IMPRESSION: 1. Right lower lobe segmental and subsegmental pulmonary embolus with likely developing right lower lobe pulmonary infarction. Subsegmental left upper lobe pulmonary embolus. Findings suggestive of associated right heart strain. 2. Trace right pleural effusion with patchy airspace opacities suggestive of infection/inflammation. Likely reactive 2 cm right hilar lymph node. Followup PA and lateral chest X-ray is recommended in 3-4 weeks following therapy to ensure resolution and exclude underlying malignancy. 3. A 5 mm right lower lobe pulmonary  nodule. No follow-up needed if patient is low-risk. Non-contrast chest CT can be considered in 12 months if patient is high-risk. This recommendation follows the consensus statement: Guidelines for Management of Incidental Pulmonary Nodules Detected on CT Images: From the Fleischner Society 2017; Radiology 2017; 284:228-243. 4. Small hiatal hernia. 5. Aortic Atherosclerosis (ICD10-I70.0) including mitral annular calcifications. These results were called by telephone at the time of interpretation on 02/14/2021 at 2:35am to provider Dr. PWaverly Ferrari who verbally acknowledged these results. Electronically Signed   By: MIven FinnM.D.   On: 02/14/2021 02:48   [place]domen Limited  Result Date: 02/13/2021 CLINICAL DATA:  Right upper quadrant abdominal pain EXAM: ULTRASOUND ABDOMEN LIMITED RIGHT UPPER QUADRANT COMPARISON:  None. FINDINGS: Gallbladder: No gallstones or wall thickening visualized.  No sonographic Murphy sign noted by sonographer. Common bile duct: Diameter: 3 mm in proximal diameter Liver: Hepatic parenchymal echogenicity and echotexture is normal. There is a 14 x 10 x 16 mm hyperechoic mass within the left hepatic lobe (image # 23) most compatible with a hemangioma in a patient without a history of malignancy. No other focal intrahepatic masses are identified. There is no intrahepatic biliary ductal dilation. Portal vein is patent on color Doppler imaging with normal direction of blood flow towards the liver. Other: None. IMPRESSION: No acute abnormality. 16 mm hyperechoic mass within the left hepatic lobe, likely representing a hemangioma in a patient without a history of malignancy. Electronically Signed   By: Fidela Salisbury MD   On: 02/13/2021 23:56   ECHOCARDIOGRAM COMPLETE  Result Date: 02/15/2021    ECHOCARDIOGRAM REPORT   Patient Name:   Helen Stanton Date of Exam: 02/15/2021 Medical Rec #:  [id number]     Height:       61.0 in Accession #:    [id number]    Weight:       189.0 lb Date of Birth:   July 07, 1946     BSA:          1.844 m Patient Age:    63 years      BP:           140/57 mmHg Patient Gender: F             HR:           72 bpm. Exam Location:  Inpatient Procedure: 2D Echo, Color Doppler and Cardiac Doppler Indications:    I48.91* Unspeicified atrial fibrillation  History:        Patient has no prior history of Echocardiogram examinations.                 Arrythmias:Atrial Fibrillation; Risk Factors:Hypertension.  Sonographer:    Bernadene Person RDCS Referring Phys: [place]  1. Left ventricular ejection fraction, by estimation, is 60 to 65%. The left ventricle has normal function. The left ventricle has no regional wall motion abnormalities. Left ventricular diastolic parameters are consistent with Grade I diastolic dysfunction (impaired relaxation).  2. Right ventricular systolic function is normal. The right ventricular size is normal. There is normal pulmonary artery systolic pressure.  3. The mitral valve is normal in structure. Mild to moderate mitral valve regurgitation. No evidence of mitral stenosis.  4. The aortic valve is normal in structure. Aortic valve regurgitation is not visualized. No aortic stenosis is present.  5. The inferior vena cava is normal in size with greater than 50% respiratory variability, suggesting right atrial pressure of 3 mmHg. FINDINGS  Left Ventricle: Left ventricular ejection fraction, by estimation, is 60 to 65%. The left ventricle has normal function. The left ventricle has no regional wall motion abnormalities. The left ventricular internal cavity size was normal in size. There is  no left ventricular hypertrophy. Left ventricular diastolic parameters are consistent with Grade I diastolic dysfunction (impaired relaxation). Right Ventricle: The right ventricular size is normal. No increase in right ventricular wall thickness. Right ventricular systolic function is normal. There is normal pulmonary artery systolic pressure. The tricuspid  regurgitant velocity is 2.45 m/s, and  with an assumed right atrial pressure of 3 mmHg, the estimated right ventricular systolic pressure is 123456 mmHg. Left Atrium: Left atrial size was normal in size. Right Atrium: Right atrial size was normal in size. Pericardium: There is no evidence of pericardial effusion.  Mitral Valve: The mitral valve is normal in structure. There is moderate thickening of the mitral valve leaflet(s). Mild to moderate mitral valve regurgitation. No evidence of mitral valve stenosis. Tricuspid Valve: The tricuspid valve is normal in structure. Tricuspid valve regurgitation is trivial. No evidence of tricuspid stenosis. Aortic Valve: The aortic valve is normal in structure. Aortic valve regurgitation is not visualized. No aortic stenosis is present. Pulmonic Valve: The pulmonic valve was normal in structure. Pulmonic valve regurgitation is not visualized. No evidence of pulmonic stenosis. Aorta: The aortic root is normal in size and structure. Venous: The inferior vena cava is normal in size with greater than 50% respiratory variability, suggesting right atrial pressure of 3 mmHg. IAS/Shunts: No atrial level shunt detected by color flow Doppler.  LEFT VENTRICLE PLAX 2D LVIDd:         3.90 cm LVIDs:         2.70 cm LV PW:         0.90 cm LV IVS:        1.10 cm LVOT diam:     1.80 cm LV SV:         39 LV SV Index:   21 LVOT Area:     2.54 cm  RIGHT VENTRICLE TAPSE (M-mode): 1.7 cm LEFT ATRIUM           Index       RIGHT ATRIUM           Index LA diam:      3.20 cm 1.74 cm/m  RA Area:     10.50 cm LA Vol (A2C): 35.4 ml 19.20 ml/m RA Volume:   19.40 ml  10.52 ml/m LA Vol (A4C): 35.5 ml 19.25 ml/m  AORTIC VALVE LVOT Vmax:   98.53 cm/s LVOT Vmean:  68.533 cm/s LVOT VTI:    0.154 m  AORTA Ao Root diam: 2.90 cm Ao Asc diam:  2.90 cm MR PISA:        1.01 cm TRICUSPID VALVE MR PISA Radius: 0.40 cm  TR Peak grad:   24.0 mmHg                          TR Vmax:        245.00 cm/s                            SHUNTS                          Systemic VTI:  0.15 m                          Systemic Diam: 1.80 cm Candee Furbish MD Electronically signed by Candee Furbish MD Signature Date/Time: 02/15/2021/2:36:42 PM    Final    VAS [place] LOWER EXTREMITY VENOUS (DVT)  Result Date: 02/14/2021  Lower Venous DVT Study Patient Name:  Helen Stanton  Date of Exam:   02/14/2021 Medical Rec #: [id number]      Accession #:    [id number] Date of Birth: 17-Jun-1946      Patient Gender: F Patient Age:   15Y Exam Location:  [facility] Procedure:      VAS [place] LOWER EXTREMITY VENOUS (DVT) Referring Phys: [place] --------------------------------------------------------------------------------  Indications: Pulmonary embolism.  Comparison Study: no prior Performing Technologist: Archie Patten RVS  Examination Guidelines: A  complete evaluation includes B-mode imaging, spectral Doppler, color Doppler, and power Doppler as needed of all accessible portions of each vessel. Bilateral testing is considered an integral part of a complete examination. Limited examinations for reoccurring indications may be performed as noted. The reflux portion of the exam is performed with the patient in reverse Trendelenburg.  +---------+---------------+---------+-----------+----------+-------------------+ RIGHT    CompressibilityPhasicitySpontaneityPropertiesThrombus Aging      +---------+---------------+---------+-----------+----------+-------------------+ CFV      Full           Yes      Yes                                      +---------+---------------+---------+-----------+----------+-------------------+ SFJ      Full                                                             +---------+---------------+---------+-----------+----------+-------------------+ FV Prox  Full                                                             +---------+---------------+---------+-----------+----------+-------------------+ FV Mid    Full                                                             +---------+---------------+---------+-----------+----------+-------------------+ FV DistalFull                                                             +---------+---------------+---------+-----------+----------+-------------------+ PFV      Full                                                             +---------+---------------+---------+-----------+----------+-------------------+ POP      Full           Yes      Yes                                      +---------+---------------+---------+-----------+----------+-------------------+ PTV      Full                                                             +---------+---------------+---------+-----------+----------+-------------------+ PERO  Not well visualized +---------+---------------+---------+-----------+----------+-------------------+   +---------+---------------+---------+-----------+----------+--------------+ LEFT     CompressibilityPhasicitySpontaneityPropertiesThrombus Aging +---------+---------------+---------+-----------+----------+--------------+ CFV      Full           Yes      Yes                                 +---------+---------------+---------+-----------+----------+--------------+ SFJ      Full                                                        +---------+---------------+---------+-----------+----------+--------------+ FV Prox  Full                                                        +---------+---------------+---------+-----------+----------+--------------+ FV Mid   Full                                                        +---------+---------------+---------+-----------+----------+--------------+ FV DistalFull                                                         +---------+---------------+---------+-----------+----------+--------------+ PFV      Full                                                        +---------+---------------+---------+-----------+----------+--------------+ POP      Full           Yes      Yes                                 +---------+---------------+---------+-----------+----------+--------------+ PTV      Full                                                        +---------+---------------+---------+-----------+----------+--------------+ PERO     Full                                                        +---------+---------------+---------+-----------+----------+--------------+     Summary: BILATERAL: - No evidence of deep vein thrombosis seen in the lower extremities, bilaterally. -No evidence of popliteal cyst, bilaterally.   *See table(s) above for measurements and observations. Electronically signed by Deitra Mayo MD  on 02/14/2021 at 4:58:25 PM.    Final     Subjective:   Patient was seen and examined 02/17/2021, 7:54 AM Patient stable today. No acute distress.  No issues overnight Stable for discharge.  Discharge Exam:    Vitals:   02/16/21 1958 02/16/21 2347 02/17/21 0422 02/17/21 0750  BP: 116/82 (!) 111/54 (!) 136/57 (!) 132/53  Pulse: 76 64 64 72  Resp: '15 16 13 16  '$ Temp: 99.3 F (37.4 C) 98.6 F (37 C) 98.6 F (37 C) 98.4 F (36.9 C)  TempSrc: Oral Oral Oral Oral  SpO2: 92% 98% 100% 100%  Weight:      Height:        General: Pt lying comfortably in bed & appears in no obvious distress. Cardiovascular: S1 & S2 heard, RRR, S1/S2 +. No murmurs, rubs, gallops or clicks. No JVD or pedal edema. Respiratory: Clear to auscultation without wheezing, rhonchi or crackles. No increased work of breathing. Abdominal:  Non-distended, non-tender & soft. No organomegaly or masses appreciated. Normal bowel sounds heard. CNS: Alert and oriented. No focal deficits. Extremities: no edema, no  cyanosis      The results of significant diagnostics from this hospitalization (including imaging, microbiology, ancillary and laboratory) are listed below for reference.      Microbiology:   Recent Results (from the past 240 hour(s))  Resp Panel by RT-PCR (Flu A&B, Covid) Nasopharyngeal Swab     Status: None   Collection Time: 02/14/21  1:30 AM   Specimen: Nasopharyngeal Swab; Nasopharyngeal(NP) swabs in vial transport medium  Result Value Ref Range Status   SARS Coronavirus 2 by RT PCR NEGATIVE NEGATIVE Final    Comment: (NOTE) SARS-CoV-2 target nucleic acids are NOT DETECTED.  The SARS-CoV-2 RNA is generally detectable in upper respiratory specimens during the acute phase of infection. The lowest concentration of SARS-CoV-2 viral copies this assay can detect is 138 copies/mL. A negative result does not preclude SARS-Cov-2 infection and should not be used as the sole basis for treatment or other patient management decisions. A negative result may occur with  improper specimen collection/handling, submission of specimen other than nasopharyngeal swab, presence of viral mutation(s) within the areas targeted by this assay, and inadequate number of viral copies(<138 copies/mL). A negative result must be combined with clinical observations, patient history, and epidemiological information. The expected result is Negative.  Fact Sheet for Patients:  EntrepreneurPulse.com.au  Fact Sheet for Healthcare Providers:  IncredibleEmployment.be  This test is no t yet approved or cleared by the [place] FDA and  has been authorized for detection and/or diagnosis of SARS-CoV-2 by FDA under an Emergency Use Authorization (EUA). This EUA will remain  in effect (meaning this test can be used) for the duration of the COVID-19 declaration under Section 564(b)(1) of the Act, 21 U.S.C.section 360bbb-3(b)(1), unless the authorization is terminated  or  revoked sooner.       Influenza A by PCR NEGATIVE NEGATIVE Final   Influenza B by PCR NEGATIVE NEGATIVE Final    Comment: (NOTE) The Xpert Xpress SARS-CoV-2/FLU/RSV plus assay is intended as an aid in the diagnosis of influenza from Nasopharyngeal swab specimens and should not be used as a sole basis for treatment. Nasal washings and aspirates are unacceptable for Xpert Xpress SARS-CoV-2/FLU/RSV testing.  Fact Sheet for Patients: EntrepreneurPulse.com.au  Fact Sheet for Healthcare Providers: IncredibleEmployment.be  This test is not yet approved or cleared by the [place] FDA and has been authorized for detection and/or diagnosis of SARS-CoV-2 by  FDA under an Emergency Use Authorization (EUA). This EUA will remain in effect (meaning this test can be used) for the duration of the COVID-19 declaration under Section 564(b)(1) of the Act, 21 U.S.C. section 360bbb-3(b)(1), unless the authorization is terminated or revoked.  Performed at [facility], [place] [address] [place], [place] [address]      Labs:   CBC: Recent Labs  Lab 02/13/21 2300 02/15/21 0022 02/16/21 0225 02/17/21 0135  WBC 13.4* 11.2* 9.8 8.8  NEUTROABS 10.1* 7.8* 6.3 5.9  HGB 12.5 11.1* 11.0* 10.4*  HCT 39.7 36.0 35.2* 32.6*  MCV 78.5* 77.1* 79.3* 76.7*  PLT 226 191 188 [id number]   Basic Metabolic Panel: Recent Labs  Lab 02/13/21 2300 02/15/21 0022 02/16/21 0225 02/17/21 0135  NA 134* 135 133* 134*  K 3.8 4.0 3.9 4.5  CL 99 99 99 98  CO2 '25 26 24 29  '$ GLUCOSE 136* 106* 110* 127*  BUN '13 17 23 19  '$ CREATININE 1.13* 1.15* 1.10* 1.25*  CALCIUM 9.4 8.9 8.7* 8.8*  MG  --  2.0 2.2 2.3   Liver Function Tests: Recent Labs  Lab 02/13/21 2300  AST 26  ALT 23  ALKPHOS 75  BILITOT 1.1  PROT 7.1  ALBUMIN 3.1*   BNP (last 3 results) No results for input(s): BNP in the last 8760 hours. Cardiac Enzymes: No results for input(s): CKTOTAL, CKMB, CKMBINDEX,  TROPONINI in the last 168 hours. CBG: No results for input(s): GLUCAP in the last 168 hours. Hgb A1c Recent Labs    02/15/21 0022  HGBA1C 6.0*   Lipid Profile Recent Labs    02/15/21 0022  CHOL 192  HDL 62  LDLCALC 112*  TRIG 89  CHOLHDL 3.1   Thyroid function studies Recent Labs    02/15/21 0022  TSH 4.594*   Anemia work up No results for input(s): VITAMINB12, FOLATE, FERRITIN, TIBC, IRON, RETICCTPCT in the last 72 hours. Urinalysis No results found for: COLORURINE, APPEARANCEUR, LABSPEC, [place], GLUCOSEU, HGBUR, BILIRUBINUR, KETONESUR, PROTEINUR, UROBILINOGEN, NITRITE, LEUKOCYTESUR       Time coordinating discharge: Over 45 minutes  SIGNED: Deatra James, MD, FACP, FHM. [facility],  Please use [email or web address] to Page If 7PM-7AM, please contact night-coverage [email or web address] 02/17/2021, 7:54 AM

## 2021-02-17 NOTE — Progress Notes (Signed)
Discharge instructions (including medications) discussed with and copy provided to patient/caregiver 

## 2021-02-17 NOTE — Plan of Care (Signed)
  Problem: Education: Goal: Knowledge of General Education information will improve Description: Including pain rating scale, medication(s)/side effects and non-pharmacologic comfort measures Outcome: Adequate for Discharge   

## 2021-02-22 ENCOUNTER — Other Ambulatory Visit: Payer: Self-pay

## 2021-02-22 ENCOUNTER — Other Ambulatory Visit: Payer: Self-pay | Admitting: Internal Medicine

## 2021-02-22 ENCOUNTER — Ambulatory Visit
Admission: RE | Admit: 2021-02-22 | Discharge: 2021-02-22 | Disposition: A | Discharge status: Home / Self Care | Payer: Medicare HMO | Source: Ambulatory Visit | Attending: Internal Medicine | Admitting: Internal Medicine

## 2021-02-22 DIAGNOSIS — I2699 Other pulmonary embolism without acute cor pulmonale: Secondary | ICD-10-CM

## 2021-02-22 DIAGNOSIS — I4891 Unspecified atrial fibrillation: Secondary | ICD-10-CM | POA: Diagnosis not present

## 2021-02-22 DIAGNOSIS — J90 Pleural effusion, not elsewhere classified: Secondary | ICD-10-CM | POA: Diagnosis not present

## 2021-02-22 DIAGNOSIS — R0602 Shortness of breath: Secondary | ICD-10-CM | POA: Diagnosis not present

## 2021-02-22 DIAGNOSIS — R911 Solitary pulmonary nodule: Secondary | ICD-10-CM | POA: Diagnosis not present

## 2021-02-23 ENCOUNTER — Other Ambulatory Visit: Payer: Self-pay

## 2021-02-23 DIAGNOSIS — E785 Hyperlipidemia, unspecified: Secondary | ICD-10-CM | POA: Insufficient documentation

## 2021-02-23 DIAGNOSIS — E7800 Pure hypercholesterolemia, unspecified: Secondary | ICD-10-CM | POA: Insufficient documentation

## 2021-02-23 DIAGNOSIS — M8588 Other specified disorders of bone density and structure, other site: Secondary | ICD-10-CM | POA: Insufficient documentation

## 2021-02-23 DIAGNOSIS — I10 Essential (primary) hypertension: Secondary | ICD-10-CM | POA: Insufficient documentation

## 2021-02-23 DIAGNOSIS — I4891 Unspecified atrial fibrillation: Secondary | ICD-10-CM | POA: Insufficient documentation

## 2021-02-23 DIAGNOSIS — Z8679 Personal history of other diseases of the circulatory system: Secondary | ICD-10-CM | POA: Insufficient documentation

## 2021-02-23 DIAGNOSIS — R911 Solitary pulmonary nodule: Secondary | ICD-10-CM | POA: Insufficient documentation

## 2021-02-24 ENCOUNTER — Ambulatory Visit: Payer: BC Managed Care – PPO | Admitting: Cardiology

## 2021-02-24 DIAGNOSIS — I119 Hypertensive heart disease without heart failure: Secondary | ICD-10-CM | POA: Diagnosis not present

## 2021-02-24 DIAGNOSIS — E038 Other specified hypothyroidism: Secondary | ICD-10-CM | POA: Diagnosis not present

## 2021-02-24 DIAGNOSIS — I480 Paroxysmal atrial fibrillation: Secondary | ICD-10-CM | POA: Diagnosis not present

## 2021-02-24 DIAGNOSIS — R0602 Shortness of breath: Secondary | ICD-10-CM | POA: Diagnosis not present

## 2021-02-24 DIAGNOSIS — R000 Tachycardia, unspecified: Secondary | ICD-10-CM | POA: Diagnosis not present

## 2021-02-24 DIAGNOSIS — I519 Heart disease, unspecified: Secondary | ICD-10-CM | POA: Diagnosis not present

## 2021-02-24 DIAGNOSIS — J45909 Unspecified asthma, uncomplicated: Secondary | ICD-10-CM | POA: Diagnosis not present

## 2021-02-24 DIAGNOSIS — I2699 Other pulmonary embolism without acute cor pulmonale: Secondary | ICD-10-CM | POA: Diagnosis not present

## 2021-02-24 DIAGNOSIS — R778 Other specified abnormalities of plasma proteins: Secondary | ICD-10-CM | POA: Diagnosis not present

## 2021-02-24 DIAGNOSIS — I2693 Single subsegmental pulmonary embolism without acute cor pulmonale: Secondary | ICD-10-CM | POA: Diagnosis not present

## 2021-02-24 DIAGNOSIS — R0609 Other forms of dyspnea: Secondary | ICD-10-CM | POA: Diagnosis not present

## 2021-02-24 DIAGNOSIS — Z7901 Long term (current) use of anticoagulants: Secondary | ICD-10-CM | POA: Diagnosis not present

## 2021-02-24 DIAGNOSIS — Z86711 Personal history of pulmonary embolism: Secondary | ICD-10-CM | POA: Diagnosis not present

## 2021-02-24 DIAGNOSIS — I248 Other forms of acute ischemic heart disease: Secondary | ICD-10-CM | POA: Diagnosis not present

## 2021-02-24 DIAGNOSIS — R0789 Other chest pain: Secondary | ICD-10-CM | POA: Diagnosis not present

## 2021-02-24 DIAGNOSIS — I5032 Chronic diastolic (congestive) heart failure: Secondary | ICD-10-CM | POA: Diagnosis not present

## 2021-02-25 DIAGNOSIS — I480 Paroxysmal atrial fibrillation: Secondary | ICD-10-CM | POA: Diagnosis not present

## 2021-02-25 DIAGNOSIS — I340 Nonrheumatic mitral (valve) insufficiency: Secondary | ICD-10-CM | POA: Diagnosis not present

## 2021-02-25 DIAGNOSIS — I371 Nonrheumatic pulmonary valve insufficiency: Secondary | ICD-10-CM | POA: Diagnosis not present

## 2021-02-25 DIAGNOSIS — I5032 Chronic diastolic (congestive) heart failure: Secondary | ICD-10-CM | POA: Diagnosis not present

## 2021-02-25 DIAGNOSIS — I361 Nonrheumatic tricuspid (valve) insufficiency: Secondary | ICD-10-CM | POA: Diagnosis not present

## 2021-02-25 DIAGNOSIS — I4891 Unspecified atrial fibrillation: Secondary | ICD-10-CM | POA: Diagnosis not present

## 2021-02-25 DIAGNOSIS — R7989 Other specified abnormal findings of blood chemistry: Secondary | ICD-10-CM | POA: Diagnosis not present

## 2021-02-25 DIAGNOSIS — I2699 Other pulmonary embolism without acute cor pulmonale: Secondary | ICD-10-CM | POA: Diagnosis not present

## 2021-02-25 DIAGNOSIS — I517 Cardiomegaly: Secondary | ICD-10-CM | POA: Diagnosis not present

## 2021-02-25 DIAGNOSIS — R778 Other specified abnormalities of plasma proteins: Secondary | ICD-10-CM | POA: Diagnosis not present

## 2021-02-25 DIAGNOSIS — I10 Essential (primary) hypertension: Secondary | ICD-10-CM | POA: Diagnosis not present

## 2021-02-25 DIAGNOSIS — I351 Nonrheumatic aortic (valve) insufficiency: Secondary | ICD-10-CM | POA: Diagnosis not present

## 2021-02-26 DIAGNOSIS — I480 Paroxysmal atrial fibrillation: Secondary | ICD-10-CM | POA: Diagnosis not present

## 2021-02-26 DIAGNOSIS — R778 Other specified abnormalities of plasma proteins: Secondary | ICD-10-CM | POA: Diagnosis not present

## 2021-02-26 DIAGNOSIS — I5032 Chronic diastolic (congestive) heart failure: Secondary | ICD-10-CM | POA: Diagnosis not present

## 2021-02-27 ENCOUNTER — Encounter: Payer: Self-pay | Admitting: Cardiology

## 2021-02-28 ENCOUNTER — Other Ambulatory Visit (HOSPITAL_COMMUNITY): Payer: Self-pay

## 2021-03-01 ENCOUNTER — Telehealth (HOSPITAL_COMMUNITY): Payer: Self-pay

## 2021-03-01 NOTE — Telephone Encounter (Signed)
Transitions of Care Pharmacy   Call attempted for a pharmacy transitions of care follow-up. HIPAA appropriate voicemail was left with call back information provided.   Call attempt #2. Will follow-up in 2-3 days.    

## 2021-03-02 ENCOUNTER — Encounter (HOSPITAL_COMMUNITY): Payer: Self-pay | Admitting: Nurse Practitioner

## 2021-03-02 ENCOUNTER — Other Ambulatory Visit: Payer: Self-pay

## 2021-03-02 ENCOUNTER — Ambulatory Visit (HOSPITAL_COMMUNITY)
Admit: 2021-03-02 | Discharge: 2021-03-02 | Disposition: A | Discharge status: Home / Self Care | Payer: BC Managed Care – PPO | Source: Ambulatory Visit | Attending: Nurse Practitioner | Admitting: Nurse Practitioner

## 2021-03-02 ENCOUNTER — Telehealth (HOSPITAL_COMMUNITY): Payer: Self-pay

## 2021-03-02 VITALS — BP 136/76 | HR 120 | Ht 61.0 in | Wt 178.8 lb

## 2021-03-02 DIAGNOSIS — I4819 Other persistent atrial fibrillation: Secondary | ICD-10-CM | POA: Diagnosis not present

## 2021-03-02 DIAGNOSIS — Z7901 Long term (current) use of anticoagulants: Secondary | ICD-10-CM | POA: Diagnosis not present

## 2021-03-02 DIAGNOSIS — I2699 Other pulmonary embolism without acute cor pulmonale: Secondary | ICD-10-CM | POA: Insufficient documentation

## 2021-03-02 DIAGNOSIS — Z8249 Family history of ischemic heart disease and other diseases of the circulatory system: Secondary | ICD-10-CM | POA: Insufficient documentation

## 2021-03-02 DIAGNOSIS — D6869 Other thrombophilia: Secondary | ICD-10-CM

## 2021-03-02 DIAGNOSIS — Z79899 Other long term (current) drug therapy: Secondary | ICD-10-CM | POA: Insufficient documentation

## 2021-03-02 MED ORDER — AMIODARONE HCL 200 MG PO TABS: 200.0000 mg | ORAL_TABLET | Freq: Two times a day (BID) | ORAL | 1 refills | Status: DC

## 2021-03-02 NOTE — Patient Instructions (Signed)
Increase Amiodarone '200mg'$ - Take one tablet by mouth twice daily

## 2021-03-02 NOTE — Telephone Encounter (Signed)
Transitions of Care Pharmacy   Call attempted for a pharmacy transitions of care follow-up. HIPAA appropriate voicemail was left with call back information provided.   Call attempt #3. Will not attempt further follow up for [facility] pharmacy.

## 2021-03-03 NOTE — Progress Notes (Signed)
Primary Care Physician: Seward Carol, MD Referring Physician: Hospital f/u   Helen Stanton is a 75 y.o. female  admitted 8/2- 8/5 dx with PE and new onset Afib. She was placed on heparin drip and cardizem IV. SHe went back into SR but then reverted to afib prior to d/c. Placed on cardizem po and was in SR on d/c. Her lisinopril/hctz was stopped in the hospital as BP was controlled with cardizem.   On discharge, she went to stay with her daughter in the [place] area. She developed afib with RVR again and was admitted to a hospital in that area. Cardiology was consulted and pt was started on amiodarone. I believe she returned to SR in the hosptil.  On f/u in the afib clinic, she is now back in afib with RVR, LBBB (old). She is on amiodarone 200 mg qd. Will plan to increase amiodarone to 200 mg bid for a full loading dose. She continues on eliquis , has not missed any doses.  Today, she denies symptoms of palpitations, chest pain, shortness of breath, orthopnea, PND, lower extremity edema, dizziness, presyncope, syncope, or neurologic sequela. The patient is tolerating medications without difficulties and is otherwise without complaint today.   Past Medical History:  Diagnosis Date   Asthma    Atrial fibrillation ([place])    Atrial fibrillation with RVR ([place]) 02/14/2021   History of PSVT (paroxysmal supraventricular tachycardia)    HTN (hypertension) 01/08/2012   Hypercholesterolemia    Hyperlipidemia    Hypertension    Medication management 06/04/2013   Osteopenia of spine    Pulmonary embolism ([place]) 02/14/2021   Pulmonary nodule    SVT (supraventricular tachycardia) ([facility])    Past Surgical History:  Procedure Laterality Date   TUBAL LIGATION  circa 1975    Current Outpatient Medications  Medication Sig Dispense Refill   albuterol (PROVENTIL HFA;VENTOLIN HFA) 108 (90 BASE) MCG/ACT inhaler Inhale 2 puffs into the lungs every 6 (six) hours as needed for wheezing.     apixaban (ELIQUIS)  5 MG TABS tablet Take 2 tablets twice daily until 02/22/21 then reduce to 1 tablet twice daily 74 tablet 2   atorvastatin (LIPITOR) 40 MG tablet Take 1 tablet by mouth daily.     calcium carbonate (OS-CAL - DOSED IN MG OF ELEMENTAL CALCIUM) 1250 MG tablet Take 1 tablet by mouth daily.     cetirizine (ZYRTEC) 10 MG tablet Take 10 mg by mouth daily.     Cholecalciferol (VITAMIN D) 50 MCG (2000 UT) CAPS Take 2,000 Units by mouth daily.     diltiazem (CARDIZEM CD) 120 MG 24 hr capsule Take 1 capsule (120 mg total) by mouth daily. 30 capsule 2   Fluticasone-Salmeterol (ADVAIR) 250-50 MCG/DOSE AEPB Inhale 1 puff into the lungs every 12 (twelve) hours.     montelukast (SINGULAIR) 10 MG tablet Take 1 tablet by mouth daily. Take 1 tablet by mouth once daily     Multiple Vitamin (MULTIVITAMIN WITH MINERALS) TABS Take 1 tablet by mouth daily.     amiodarone (PACERONE) 200 MG tablet Take 1 tablet (200 mg total) by mouth in the morning and at bedtime. 60 tablet 1   No current facility-administered medications for this encounter.    Allergies  Allergen Reactions   Fish Allergy Shortness Of Breath   Glucosamine Shortness Of Breath   Milk-Related Compounds Shortness Of Breath   Diltiazem Hcl Rash    Social History   Socioeconomic History   Marital status: Unknown  Spouse name: Not on file   Number of children: Not on file   Years of education: Not on file   Highest education level: Not on file  Occupational History   Not on file  Tobacco Use   Smoking status: Never   Smokeless tobacco: Never  Vaping Use   Vaping Use: Never used  Substance and Sexual Activity   Alcohol use: No   Drug use: No   Sexual activity: Not on file  Other Topics Concern   Not on file  Social History Narrative   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   Hobbies: sewing and gardening, and Barrister's clerk   Social Determinants of Health   Financial Resource Strain: Not on file  Food Insecurity: Not on file   Transportation Needs: Not on file  Physical Activity: Not on file  Stress: Not on file  Social Connections: Not on file  Intimate Partner Violence: Not on file    Family History  Problem Relation Age of Onset   Hypertension Mother    Dementia Mother    Hypertension Father    Dementia Father    Diabetes Father     ROS- All systems are reviewed and negative except as per the HPI above  Physical Exam: Vitals:   03/02/21 1351  BP: 136/76  Pulse: (!) 120  Weight: 81.1 kg  Height: '5\' 1"'$  (1.549 m)   Wt Readings from Last 3 Encounters:  03/02/21 81.1 kg  02/15/21 85.7 kg  06/04/13 73.5 kg    Labs: Lab Results  Component Value Date   NA 134 (L) 02/17/2021   K 4.5 02/17/2021   CL 98 02/17/2021   CO2 29 02/17/2021   GLUCOSE 127 (H) 02/17/2021   BUN 19 02/17/2021   CREATININE 1.25 (H) 02/17/2021   CALCIUM 8.8 (L) 02/17/2021   MG 2.3 02/17/2021   Lab Results  Component Value Date   INR 0.94 01/07/2012   Lab Results  Component Value Date   CHOL 192 02/15/2021   HDL 62 02/15/2021   LDLCALC 112 (H) 02/15/2021   TRIG 89 02/15/2021     GEN- The patient is well appearing, alert and oriented x 3 today.   Head- normocephalic, atraumatic Eyes-  Sclera clear, conjunctiva pink Ears- hearing intact Oropharynx- clear Neck- supple, no JVP Lymph- no cervical lymphadenopathy Lungs- Clear to ausculation bilaterally, normal work of breathing Heart- Regular rate and rhythm, no murmurs, rubs or gallops, PMI not laterally displaced GI- soft, NT, ND, + BS Extremities- no clubbing, cyanosis, or edema MS- no significant deformity or atrophy Skin- no rash or lesion Psych- euthymic mood, full affect Neuro- strength and sensation are intact  EKG-afib at 120 bpm, qrs int 114 ms, qtc 460 ms   Echo-Left ventricular ejection fraction, by estimation, is 60 to 65%. The left ventricle has normal function. The left ventricle has no regional wall motion abnormalities. Left ventricular  diastolic parameters are consistent with Grade I diastolic dysfunction (impaired relaxation). 1. Right ventricular systolic function is normal. The right ventricular size is normal. There is normal pulmonary artery systolic pressure. 2. The mitral valve is normal in structure. Mild to moderate mitral valve regurgitation. No evidence of mitral stenosis. 3. The aortic valve is normal in structure. Aortic valve regurgitation is not visualized. No aortic stenosis is present. 4. The inferior vena cava is normal in size with greater than 50% respiratory variability, suggesting right atrial pressure of 3 mmHg.  Assessment and Plan:  1. Persistent afib General  education re afib and triggers discussed, no ne identified She was placed on amiodarone 200 mg daily when admitted 02/24/21 in the [place] area but  still has afib with rvr Will increase amiodarone to 200 mg bid for the next several weeks so she will have loaded sooner to control HR Continue cardia xt 120 mg daily   2. PE (dx 02/13/21) On higher dose eliquis per DVT/PE protocol initially, now on 5 mg bid   3. CHA2DS2VASc  score of at 7 Continue eliquis 5 mg bid   I will see back in 2 weeks   Butch Penny C. Dyson Sevey, [facility] [address] [place], [place] [address] [phone]

## 2021-03-04 DIAGNOSIS — I0989 Other specified rheumatic heart diseases: Secondary | ICD-10-CM | POA: Diagnosis not present

## 2021-03-04 DIAGNOSIS — Z6833 Body mass index (BMI) 33.0-33.9, adult: Secondary | ICD-10-CM | POA: Diagnosis not present

## 2021-03-04 DIAGNOSIS — R55 Syncope and collapse: Secondary | ICD-10-CM | POA: Diagnosis not present

## 2021-03-04 DIAGNOSIS — N200 Calculus of kidney: Secondary | ICD-10-CM | POA: Diagnosis not present

## 2021-03-04 DIAGNOSIS — J90 Pleural effusion, not elsewhere classified: Secondary | ICD-10-CM | POA: Diagnosis not present

## 2021-03-04 DIAGNOSIS — J918 Pleural effusion in other conditions classified elsewhere: Secondary | ICD-10-CM | POA: Diagnosis not present

## 2021-03-04 DIAGNOSIS — I130 Hypertensive heart and chronic kidney disease with heart failure and stage 1 through stage 4 chronic kidney disease, or unspecified chronic kidney disease: Secondary | ICD-10-CM | POA: Diagnosis not present

## 2021-03-04 DIAGNOSIS — E782 Mixed hyperlipidemia: Secondary | ICD-10-CM | POA: Diagnosis not present

## 2021-03-04 DIAGNOSIS — I081 Rheumatic disorders of both mitral and tricuspid valves: Secondary | ICD-10-CM | POA: Diagnosis not present

## 2021-03-04 DIAGNOSIS — J4530 Mild persistent asthma, uncomplicated: Secondary | ICD-10-CM | POA: Diagnosis not present

## 2021-03-04 DIAGNOSIS — I5033 Acute on chronic diastolic (congestive) heart failure: Secondary | ICD-10-CM | POA: Diagnosis not present

## 2021-03-04 DIAGNOSIS — D649 Anemia, unspecified: Secondary | ICD-10-CM | POA: Diagnosis not present

## 2021-03-04 DIAGNOSIS — Z86711 Personal history of pulmonary embolism: Secondary | ICD-10-CM | POA: Diagnosis not present

## 2021-03-04 DIAGNOSIS — I2699 Other pulmonary embolism without acute cor pulmonale: Secondary | ICD-10-CM | POA: Diagnosis not present

## 2021-03-04 DIAGNOSIS — I340 Nonrheumatic mitral (valve) insufficiency: Secondary | ICD-10-CM | POA: Diagnosis not present

## 2021-03-04 DIAGNOSIS — J9811 Atelectasis: Secondary | ICD-10-CM | POA: Diagnosis not present

## 2021-03-04 DIAGNOSIS — I4819 Other persistent atrial fibrillation: Secondary | ICD-10-CM | POA: Diagnosis not present

## 2021-03-04 DIAGNOSIS — I447 Left bundle-branch block, unspecified: Secondary | ICD-10-CM | POA: Diagnosis not present

## 2021-03-04 DIAGNOSIS — R0789 Other chest pain: Secondary | ICD-10-CM | POA: Diagnosis not present

## 2021-03-04 DIAGNOSIS — D631 Anemia in chronic kidney disease: Secondary | ICD-10-CM | POA: Diagnosis not present

## 2021-03-04 DIAGNOSIS — I480 Paroxysmal atrial fibrillation: Secondary | ICD-10-CM | POA: Diagnosis not present

## 2021-03-04 DIAGNOSIS — N182 Chronic kidney disease, stage 2 (mild): Secondary | ICD-10-CM | POA: Diagnosis not present

## 2021-03-04 DIAGNOSIS — I10 Essential (primary) hypertension: Secondary | ICD-10-CM | POA: Diagnosis not present

## 2021-03-04 DIAGNOSIS — N250 Renal osteodystrophy: Secondary | ICD-10-CM | POA: Diagnosis not present

## 2021-03-04 DIAGNOSIS — R079 Chest pain, unspecified: Secondary | ICD-10-CM | POA: Diagnosis not present

## 2021-03-04 DIAGNOSIS — I509 Heart failure, unspecified: Secondary | ICD-10-CM | POA: Diagnosis not present

## 2021-03-04 DIAGNOSIS — N281 Cyst of kidney, acquired: Secondary | ICD-10-CM | POA: Diagnosis not present

## 2021-03-04 DIAGNOSIS — N179 Acute kidney failure, unspecified: Secondary | ICD-10-CM | POA: Diagnosis not present

## 2021-03-04 DIAGNOSIS — R911 Solitary pulmonary nodule: Secondary | ICD-10-CM | POA: Diagnosis not present

## 2021-03-04 DIAGNOSIS — E669 Obesity, unspecified: Secondary | ICD-10-CM | POA: Diagnosis not present

## 2021-03-04 DIAGNOSIS — R0602 Shortness of breath: Secondary | ICD-10-CM | POA: Diagnosis not present

## 2021-03-04 DIAGNOSIS — I4891 Unspecified atrial fibrillation: Secondary | ICD-10-CM | POA: Diagnosis not present

## 2021-03-04 DIAGNOSIS — N3289 Other specified disorders of bladder: Secondary | ICD-10-CM | POA: Diagnosis not present

## 2021-03-04 DIAGNOSIS — Z6832 Body mass index (BMI) 32.0-32.9, adult: Secondary | ICD-10-CM | POA: Diagnosis not present

## 2021-03-04 DIAGNOSIS — J9601 Acute respiratory failure with hypoxia: Secondary | ICD-10-CM | POA: Diagnosis not present

## 2021-03-05 DIAGNOSIS — I340 Nonrheumatic mitral (valve) insufficiency: Secondary | ICD-10-CM | POA: Diagnosis not present

## 2021-03-05 DIAGNOSIS — Z86711 Personal history of pulmonary embolism: Secondary | ICD-10-CM | POA: Diagnosis not present

## 2021-03-05 DIAGNOSIS — I4891 Unspecified atrial fibrillation: Secondary | ICD-10-CM | POA: Diagnosis not present

## 2021-03-05 DIAGNOSIS — J90 Pleural effusion, not elsewhere classified: Secondary | ICD-10-CM | POA: Diagnosis not present

## 2021-03-05 DIAGNOSIS — R55 Syncope and collapse: Secondary | ICD-10-CM | POA: Diagnosis not present

## 2021-03-14 ENCOUNTER — Ambulatory Visit (HOSPITAL_COMMUNITY): Payer: BC Managed Care – PPO | Admitting: Nurse Practitioner

## 2021-03-17 DIAGNOSIS — Z09 Encounter for follow-up examination after completed treatment for conditions other than malignant neoplasm: Secondary | ICD-10-CM | POA: Diagnosis not present

## 2021-03-17 DIAGNOSIS — I5033 Acute on chronic diastolic (congestive) heart failure: Secondary | ICD-10-CM | POA: Diagnosis not present

## 2021-03-17 DIAGNOSIS — R911 Solitary pulmonary nodule: Secondary | ICD-10-CM | POA: Diagnosis not present

## 2021-03-17 DIAGNOSIS — I482 Chronic atrial fibrillation, unspecified: Secondary | ICD-10-CM | POA: Diagnosis not present

## 2021-03-17 DIAGNOSIS — I10 Essential (primary) hypertension: Secondary | ICD-10-CM | POA: Diagnosis not present

## 2021-03-17 DIAGNOSIS — J90 Pleural effusion, not elsewhere classified: Secondary | ICD-10-CM | POA: Diagnosis not present

## 2021-03-21 DIAGNOSIS — I10 Essential (primary) hypertension: Secondary | ICD-10-CM | POA: Diagnosis not present

## 2021-03-21 DIAGNOSIS — I2699 Other pulmonary embolism without acute cor pulmonale: Secondary | ICD-10-CM | POA: Diagnosis not present

## 2021-03-21 DIAGNOSIS — I340 Nonrheumatic mitral (valve) insufficiency: Secondary | ICD-10-CM | POA: Diagnosis not present

## 2021-03-21 DIAGNOSIS — Z6834 Body mass index (BMI) 34.0-34.9, adult: Secondary | ICD-10-CM | POA: Diagnosis not present

## 2021-03-21 DIAGNOSIS — I480 Paroxysmal atrial fibrillation: Secondary | ICD-10-CM | POA: Diagnosis not present

## 2021-03-21 DIAGNOSIS — I5033 Acute on chronic diastolic (congestive) heart failure: Secondary | ICD-10-CM | POA: Diagnosis not present

## 2021-03-24 ENCOUNTER — Other Ambulatory Visit (HOSPITAL_COMMUNITY): Payer: Self-pay | Admitting: Nurse Practitioner

## 2021-04-07 DIAGNOSIS — E668 Other obesity: Secondary | ICD-10-CM | POA: Diagnosis not present

## 2021-04-07 DIAGNOSIS — I480 Paroxysmal atrial fibrillation: Secondary | ICD-10-CM | POA: Diagnosis not present

## 2021-04-07 DIAGNOSIS — I5033 Acute on chronic diastolic (congestive) heart failure: Secondary | ICD-10-CM | POA: Diagnosis not present

## 2021-04-07 DIAGNOSIS — I2699 Other pulmonary embolism without acute cor pulmonale: Secondary | ICD-10-CM | POA: Diagnosis not present

## 2021-04-07 DIAGNOSIS — I10 Essential (primary) hypertension: Secondary | ICD-10-CM | POA: Diagnosis not present

## 2021-04-07 DIAGNOSIS — I340 Nonrheumatic mitral (valve) insufficiency: Secondary | ICD-10-CM | POA: Diagnosis not present

## 2021-04-07 DIAGNOSIS — R0609 Other forms of dyspnea: Secondary | ICD-10-CM | POA: Diagnosis not present

## 2021-04-11 DIAGNOSIS — I10 Essential (primary) hypertension: Secondary | ICD-10-CM | POA: Diagnosis not present

## 2021-04-11 DIAGNOSIS — N179 Acute kidney failure, unspecified: Secondary | ICD-10-CM | POA: Diagnosis not present

## 2021-04-17 DIAGNOSIS — Z6833 Body mass index (BMI) 33.0-33.9, adult: Secondary | ICD-10-CM | POA: Diagnosis not present

## 2021-04-17 DIAGNOSIS — N1832 Chronic kidney disease, stage 3b: Secondary | ICD-10-CM | POA: Diagnosis not present

## 2021-04-17 DIAGNOSIS — I150 Renovascular hypertension: Secondary | ICD-10-CM | POA: Diagnosis not present

## 2021-04-19 ENCOUNTER — Other Ambulatory Visit (HOSPITAL_COMMUNITY): Payer: Self-pay | Admitting: Nurse Practitioner

## 2021-04-24 DIAGNOSIS — I2699 Other pulmonary embolism without acute cor pulmonale: Secondary | ICD-10-CM | POA: Diagnosis not present

## 2021-04-24 DIAGNOSIS — I5032 Chronic diastolic (congestive) heart failure: Secondary | ICD-10-CM | POA: Diagnosis not present

## 2021-04-24 DIAGNOSIS — R0602 Shortness of breath: Secondary | ICD-10-CM | POA: Diagnosis not present

## 2021-04-24 DIAGNOSIS — J90 Pleural effusion, not elsewhere classified: Secondary | ICD-10-CM | POA: Diagnosis not present

## 2021-04-24 DIAGNOSIS — J4541 Moderate persistent asthma with (acute) exacerbation: Secondary | ICD-10-CM | POA: Diagnosis not present

## 2021-04-24 DIAGNOSIS — R911 Solitary pulmonary nodule: Secondary | ICD-10-CM | POA: Diagnosis not present

## 2021-04-24 DIAGNOSIS — Z6834 Body mass index (BMI) 34.0-34.9, adult: Secondary | ICD-10-CM | POA: Diagnosis not present

## 2021-04-26 ENCOUNTER — Telehealth: Payer: Self-pay

## 2021-04-26 NOTE — Telephone Encounter (Signed)
NOTES SCANNED TO REFERRAL 

## 2021-06-06 DIAGNOSIS — M8588 Other specified disorders of bone density and structure, other site: Secondary | ICD-10-CM | POA: Diagnosis not present

## 2021-06-06 DIAGNOSIS — R911 Solitary pulmonary nodule: Secondary | ICD-10-CM | POA: Diagnosis not present

## 2021-06-06 DIAGNOSIS — I4891 Unspecified atrial fibrillation: Secondary | ICD-10-CM | POA: Diagnosis not present

## 2021-06-06 DIAGNOSIS — J45909 Unspecified asthma, uncomplicated: Secondary | ICD-10-CM | POA: Diagnosis not present

## 2021-06-06 DIAGNOSIS — Z0000 Encounter for general adult medical examination without abnormal findings: Secondary | ICD-10-CM | POA: Diagnosis not present

## 2021-06-06 DIAGNOSIS — E7800 Pure hypercholesterolemia, unspecified: Secondary | ICD-10-CM | POA: Diagnosis not present

## 2021-06-06 DIAGNOSIS — I2699 Other pulmonary embolism without acute cor pulmonale: Secondary | ICD-10-CM | POA: Diagnosis not present

## 2021-06-06 DIAGNOSIS — Z23 Encounter for immunization: Secondary | ICD-10-CM | POA: Diagnosis not present

## 2021-06-19 DIAGNOSIS — R748 Abnormal levels of other serum enzymes: Secondary | ICD-10-CM | POA: Diagnosis not present

## 2021-06-22 DIAGNOSIS — I480 Paroxysmal atrial fibrillation: Secondary | ICD-10-CM | POA: Diagnosis not present

## 2021-06-22 DIAGNOSIS — Z86711 Personal history of pulmonary embolism: Secondary | ICD-10-CM | POA: Diagnosis not present

## 2021-06-22 DIAGNOSIS — I5032 Chronic diastolic (congestive) heart failure: Secondary | ICD-10-CM | POA: Diagnosis not present

## 2021-06-22 DIAGNOSIS — I340 Nonrheumatic mitral (valve) insufficiency: Secondary | ICD-10-CM | POA: Diagnosis not present

## 2021-07-21 DIAGNOSIS — Z9849 Cataract extraction status, unspecified eye: Secondary | ICD-10-CM | POA: Diagnosis not present

## 2021-07-21 DIAGNOSIS — H524 Presbyopia: Secondary | ICD-10-CM | POA: Diagnosis not present

## 2021-07-21 DIAGNOSIS — H52223 Regular astigmatism, bilateral: Secondary | ICD-10-CM | POA: Diagnosis not present

## 2021-07-21 DIAGNOSIS — H35372 Puckering of macula, left eye: Secondary | ICD-10-CM | POA: Diagnosis not present

## 2021-07-21 DIAGNOSIS — H5202 Hypermetropia, left eye: Secondary | ICD-10-CM | POA: Diagnosis not present

## 2021-08-25 DIAGNOSIS — N1832 Chronic kidney disease, stage 3b: Secondary | ICD-10-CM | POA: Diagnosis not present

## 2021-08-28 DIAGNOSIS — I2699 Other pulmonary embolism without acute cor pulmonale: Secondary | ICD-10-CM | POA: Diagnosis not present

## 2021-08-28 DIAGNOSIS — R911 Solitary pulmonary nodule: Secondary | ICD-10-CM | POA: Diagnosis not present

## 2021-08-28 DIAGNOSIS — J4541 Moderate persistent asthma with (acute) exacerbation: Secondary | ICD-10-CM | POA: Diagnosis not present

## 2021-09-07 DIAGNOSIS — R911 Solitary pulmonary nodule: Secondary | ICD-10-CM | POA: Diagnosis not present

## 2021-09-07 DIAGNOSIS — N184 Chronic kidney disease, stage 4 (severe): Secondary | ICD-10-CM | POA: Diagnosis not present

## 2021-09-07 DIAGNOSIS — I150 Renovascular hypertension: Secondary | ICD-10-CM | POA: Diagnosis not present

## 2021-09-07 DIAGNOSIS — R918 Other nonspecific abnormal finding of lung field: Secondary | ICD-10-CM | POA: Diagnosis not present

## 2021-09-21 DIAGNOSIS — N184 Chronic kidney disease, stage 4 (severe): Secondary | ICD-10-CM | POA: Diagnosis not present

## 2021-09-25 DIAGNOSIS — E1122 Type 2 diabetes mellitus with diabetic chronic kidney disease: Secondary | ICD-10-CM | POA: Diagnosis not present

## 2021-09-25 DIAGNOSIS — N184 Chronic kidney disease, stage 4 (severe): Secondary | ICD-10-CM | POA: Diagnosis not present

## 2021-10-02 DIAGNOSIS — N1832 Chronic kidney disease, stage 3b: Secondary | ICD-10-CM | POA: Diagnosis not present

## 2021-10-02 DIAGNOSIS — I150 Renovascular hypertension: Secondary | ICD-10-CM | POA: Diagnosis not present

## 2021-10-02 DIAGNOSIS — J8283 Eosinophilic asthma: Secondary | ICD-10-CM | POA: Diagnosis not present

## 2021-10-02 DIAGNOSIS — Z6832 Body mass index (BMI) 32.0-32.9, adult: Secondary | ICD-10-CM | POA: Diagnosis not present

## 2021-12-04 DIAGNOSIS — J8283 Eosinophilic asthma: Secondary | ICD-10-CM | POA: Diagnosis not present

## 2021-12-04 DIAGNOSIS — J4541 Moderate persistent asthma with (acute) exacerbation: Secondary | ICD-10-CM | POA: Diagnosis not present

## 2021-12-04 DIAGNOSIS — I5032 Chronic diastolic (congestive) heart failure: Secondary | ICD-10-CM | POA: Diagnosis not present

## 2021-12-04 DIAGNOSIS — R0602 Shortness of breath: Secondary | ICD-10-CM | POA: Diagnosis not present

## 2021-12-05 DIAGNOSIS — N1831 Chronic kidney disease, stage 3a: Secondary | ICD-10-CM | POA: Diagnosis not present

## 2021-12-05 DIAGNOSIS — R911 Solitary pulmonary nodule: Secondary | ICD-10-CM | POA: Diagnosis not present

## 2021-12-05 DIAGNOSIS — I2699 Other pulmonary embolism without acute cor pulmonale: Secondary | ICD-10-CM | POA: Diagnosis not present

## 2021-12-05 DIAGNOSIS — I700 Atherosclerosis of aorta: Secondary | ICD-10-CM | POA: Diagnosis not present

## 2021-12-05 DIAGNOSIS — I4891 Unspecified atrial fibrillation: Secondary | ICD-10-CM | POA: Diagnosis not present

## 2021-12-05 DIAGNOSIS — M8588 Other specified disorders of bone density and structure, other site: Secondary | ICD-10-CM | POA: Diagnosis not present

## 2022-01-04 DIAGNOSIS — I5032 Chronic diastolic (congestive) heart failure: Secondary | ICD-10-CM | POA: Diagnosis not present

## 2022-01-04 DIAGNOSIS — I480 Paroxysmal atrial fibrillation: Secondary | ICD-10-CM | POA: Diagnosis not present

## 2022-01-04 DIAGNOSIS — E7800 Pure hypercholesterolemia, unspecified: Secondary | ICD-10-CM | POA: Diagnosis not present

## 2022-01-19 DIAGNOSIS — N1832 Chronic kidney disease, stage 3b: Secondary | ICD-10-CM | POA: Diagnosis not present

## 2022-01-30 DIAGNOSIS — I150 Renovascular hypertension: Secondary | ICD-10-CM | POA: Diagnosis not present

## 2022-01-30 DIAGNOSIS — N1832 Chronic kidney disease, stage 3b: Secondary | ICD-10-CM | POA: Diagnosis not present

## 2022-02-16 IMAGING — CT CT ANGIO CHEST
2 of 7 series · 16 of 46 positions shown · IV contrast (APPLIED)
Comparison: Chest x-ray 02/14/2021

CLINICAL DATA: RUQ pain, goes up to R shoulder. Worse w movement,
began approx 8pm [DATE].

EXAM:
CT ANGIOGRAPHY CHEST WITH CONTRAST
TECHNIQUE: Multidetector CT imaging of the chest was performed using the
standard protocol during bolus administration of intravenous
contrast. Multiplanar CT image reconstructions and MIPs were
obtained to evaluate the vascular anatomy.
CONTRAST:  64mL OMNIPAQUE IOHEXOL 350 MG/ML SOLN

[Series 7: thins · axial · 0.67mm/px · z∈[-419,-200]mm · 13 of 352 slices shown]
[im 20/352  lung]
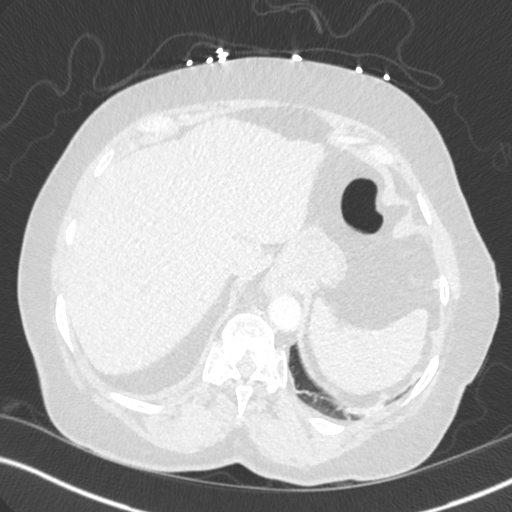
[im 40/352  soft-tissue]
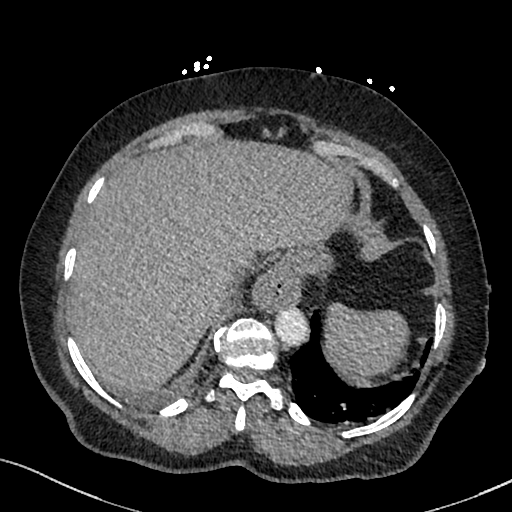
[im 79/352  lung]
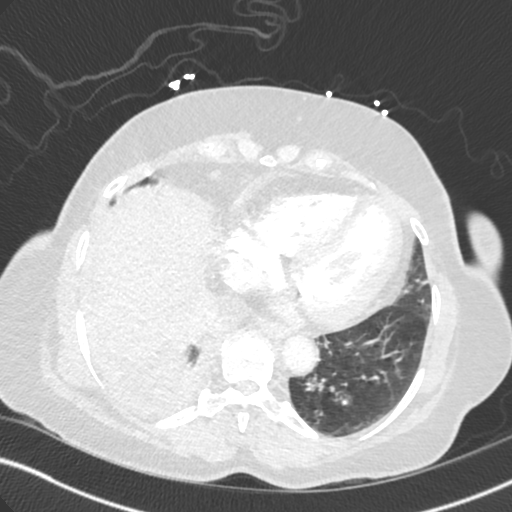
[im 98/352  soft-tissue]
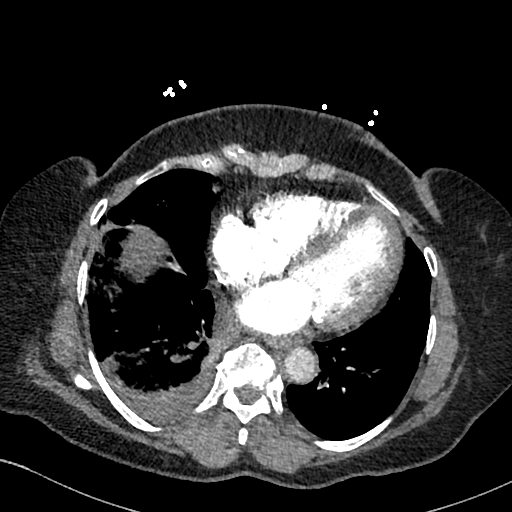
[im 118/352  lung]
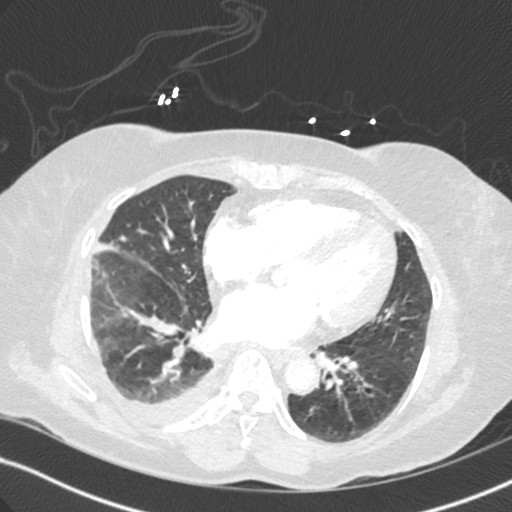
[im 157/352  soft-tissue]
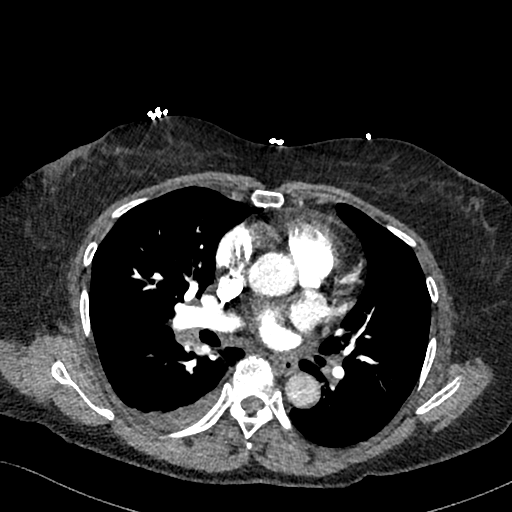
[im 176/352  lung]
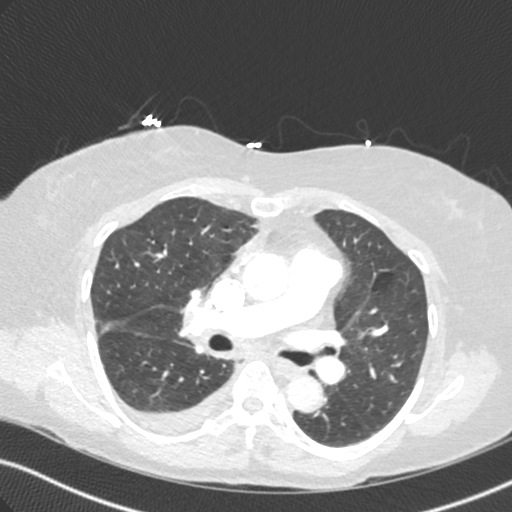
[im 196/352  soft-tissue]
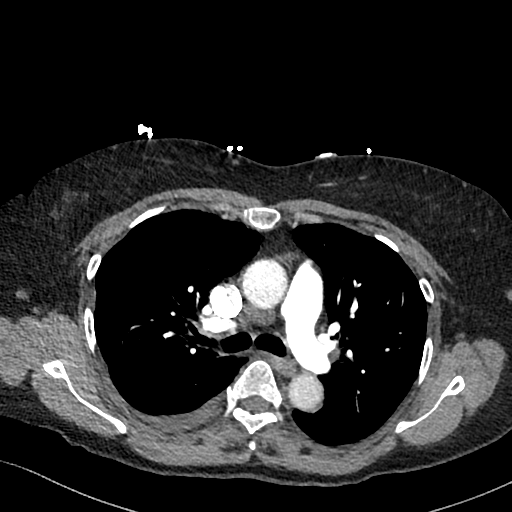
[im 235/352  lung]
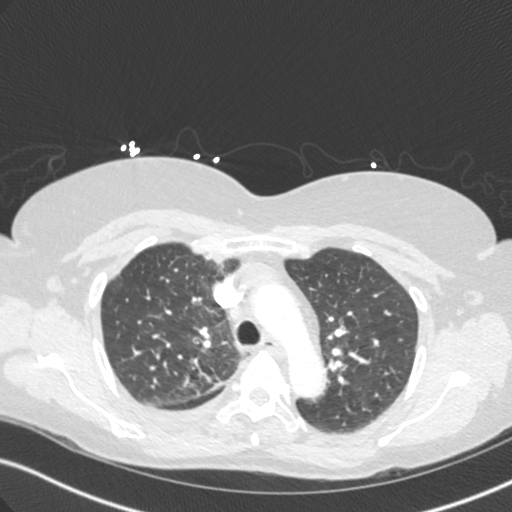
[im 254/352  soft-tissue]
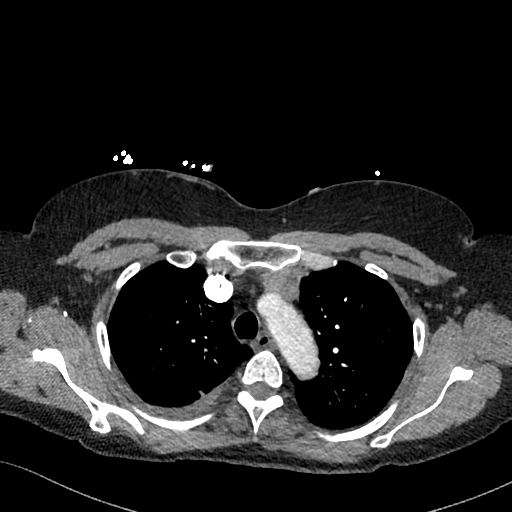
[im 274/352  lung]
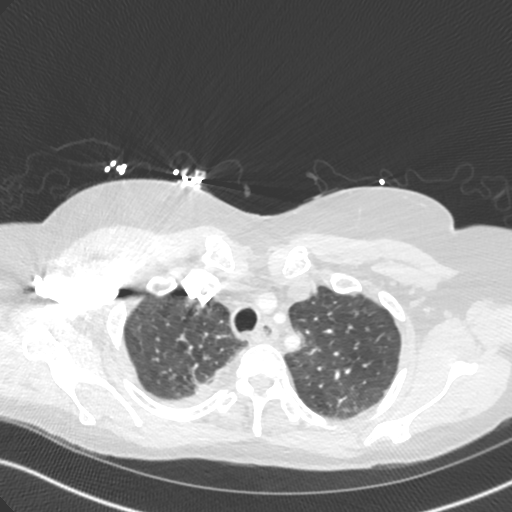
[im 313/352  soft-tissue]
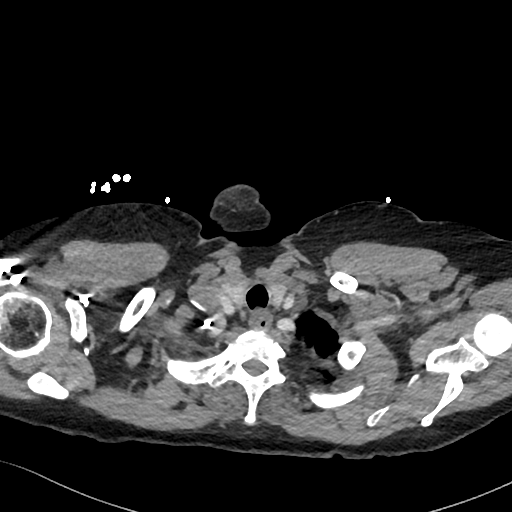
[im 332/352  lung]
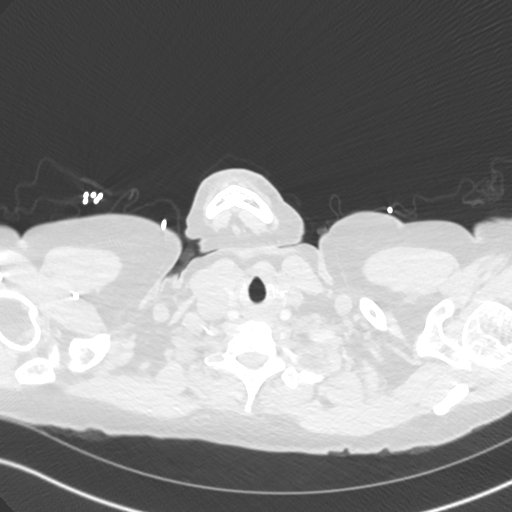

[Series 8: cor · coronal · 0.49mm/px · 3 of 137 slices shown]
[im 35/137  soft-tissue]
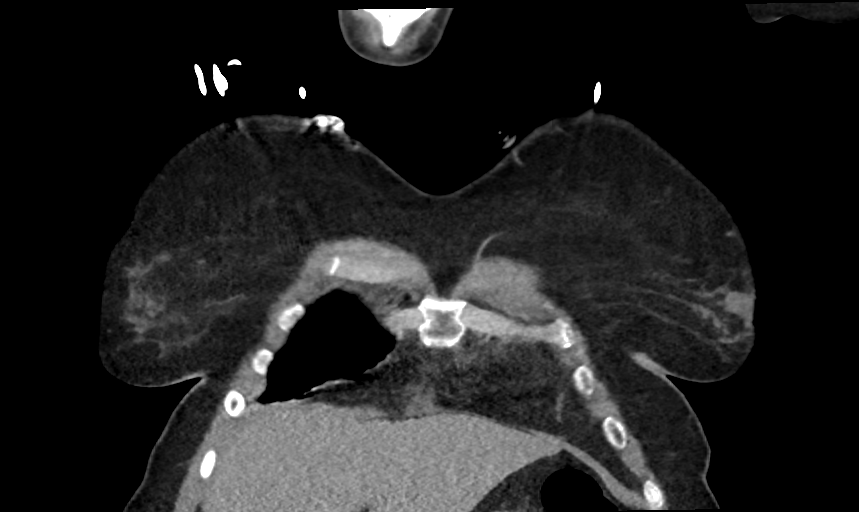
[im 69/137  soft-tissue]
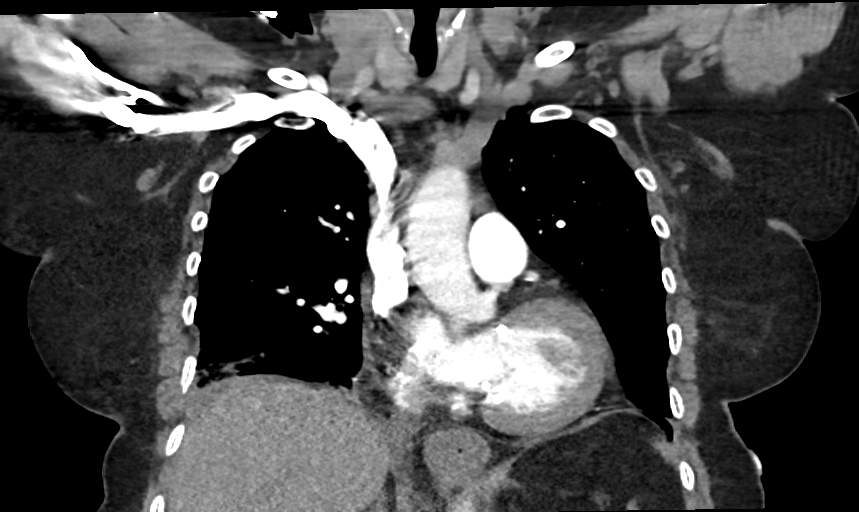
[im 103/137  soft-tissue]
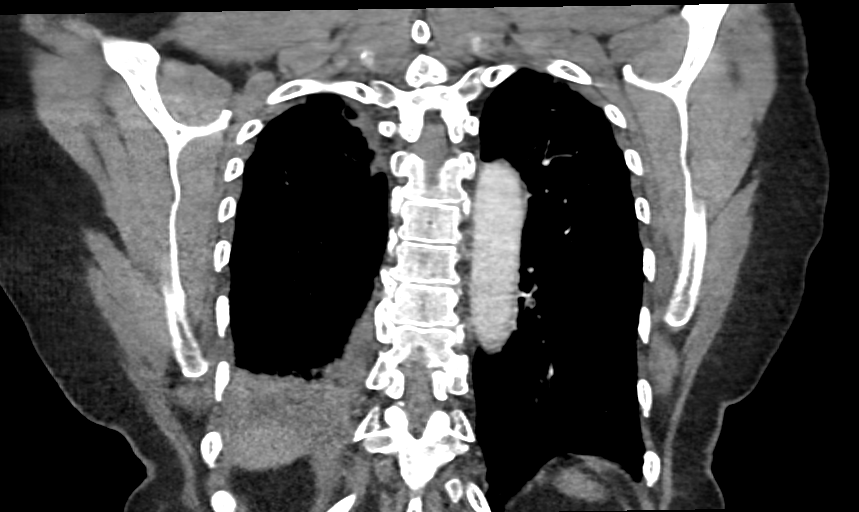

[16 of 46 positions shown; findings below may reference images not displayed]

FINDINGS: Cardiovascular: Satisfactory opacification of the pulmonary arteries
to the segmental level. Segmental and subsegmental right lower lobe
pulmonary embolus. Subsegmental left upper lobe pulmonary embolus.
Right to left ventricular ratio of 1. Paradoxical bowing of the
interventricular septum. Mitral annular calcifications at least
moderate. No significant pericardial effusion. The thoracic aorta is
normal in caliber. Mild atherosclerotic plaque of the thoracic
aorta. No coronary artery calcifications.

Mediastinum/Nodes: There is a 2 cm right hilar lymph node. No
enlarged mediastinal or axillary lymph nodes. Thyroid gland,
trachea, and esophagus demonstrate no significant findings. Small
hiatal hernia.

Lungs/Pleura: Patchy airspace opacity of the right lower lobe as
well as interval development of right base peripheral wedge-shaped
airspace opacity. Trace right pleural effusion. No left pleural
effusion. Pulmonary micronodule within the right middle lobe. 5 mm
pulmonary nodule within the right lower lobe ([DATE]). No pulmonary
mass.

Upper Abdomen: No acute abnormality.

Musculoskeletal:

No chest wall abnormality.

No suspicious lytic or blastic osseous lesions. No acute displaced
fracture. Multilevel degenerative changes of the spine.

Review of the MIP images confirms the above findings.
IMPRESSION: 1. Right lower lobe segmental and subsegmental pulmonary embolus
with likely developing right lower lobe pulmonary infarction.
Subsegmental left upper lobe pulmonary embolus. Findings suggestive
of associated right heart strain.
2. Trace right pleural effusion with patchy airspace opacities
suggestive of infection/inflammation. Likely reactive 2 cm right
hilar lymph node. Followup PA and lateral chest X-ray is recommended
in 3-4 weeks following therapy to ensure resolution and exclude
underlying malignancy.
3. A 5 mm right lower lobe pulmonary nodule. No follow-up needed if
patient is low-risk. Non-contrast chest CT can be considered in 12
months if patient is high-risk. This recommendation follows the
consensus statement: Guidelines for Management of Incidental
Pulmonary Nodules Detected on CT Images: From the [HOSPITAL]
4. Small hiatal hernia.
5. Aortic Atherosclerosis ([id number]) including mitral annular
calcifications.

These results were called by telephone at the time of interpretation
on 02/14/2021 at [DATE] to provider Dr. Labelle Salha, who verbally
acknowledged these results.

## 2022-02-24 IMAGING — CR DG CHEST 2V
2 series · 2 of 2 positions shown · non-contrast
Comparison: February 13, 2021.

CLINICAL DATA: Shortness of breath.

EXAM:
CHEST - 2 VIEW

[w chest pa]
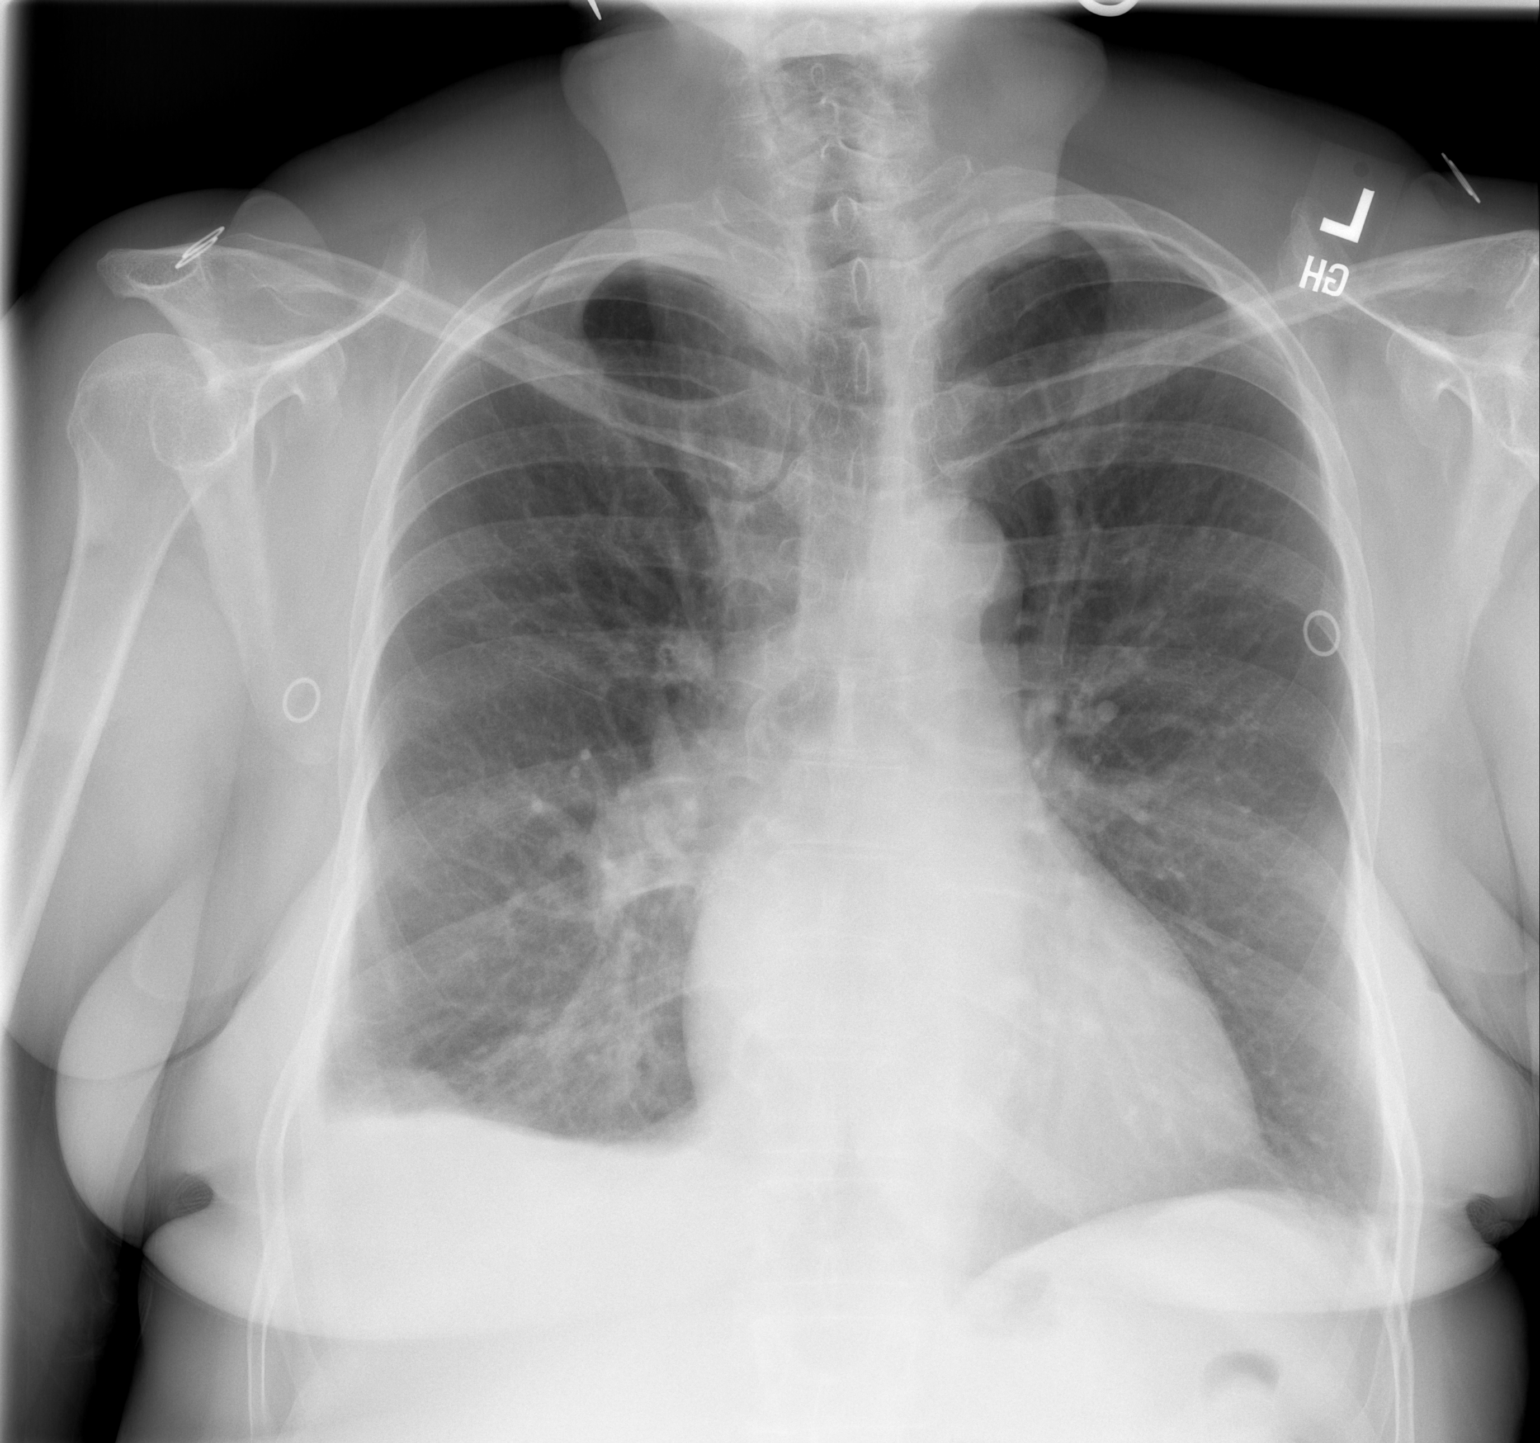

[w chest lat]
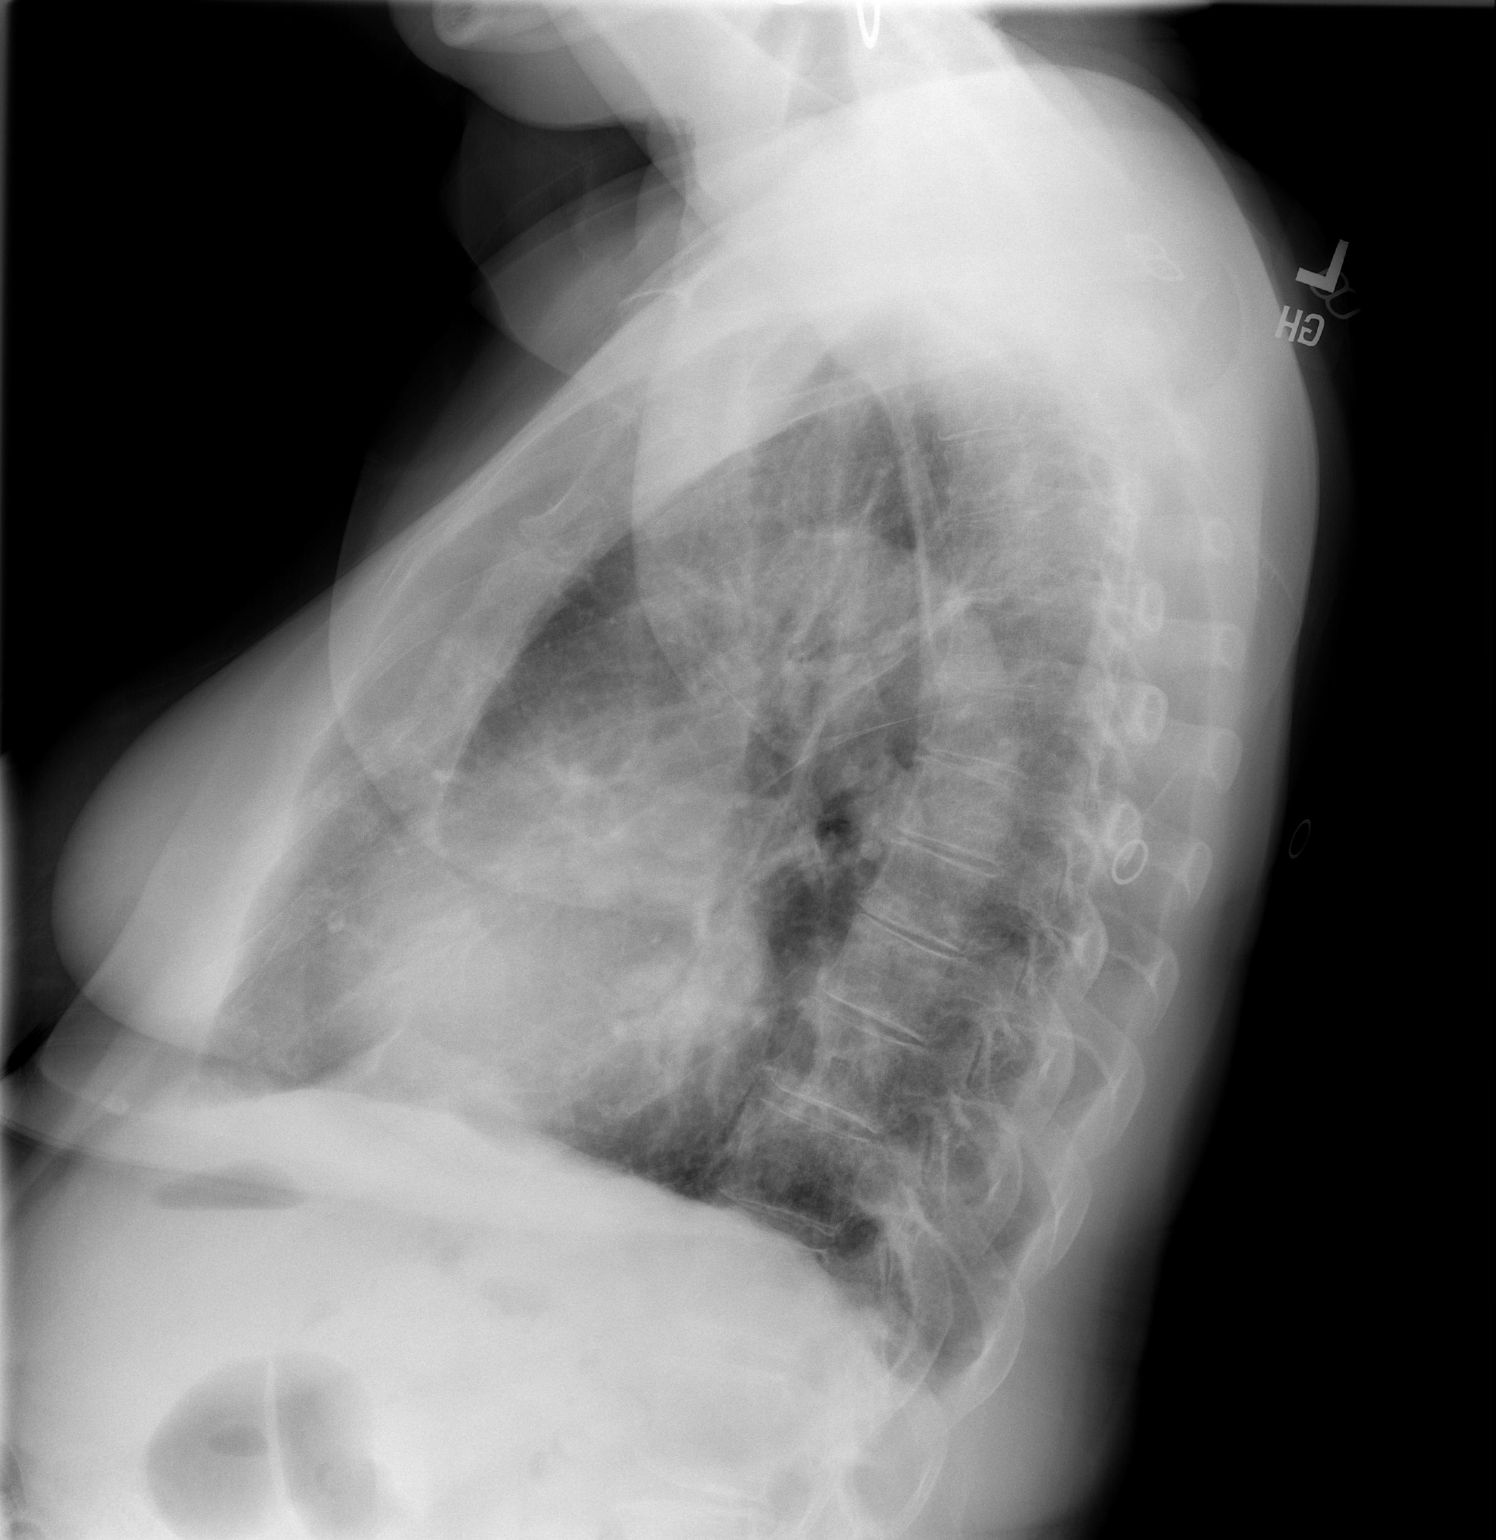

[2 of 2 positions shown; findings below may reference images not displayed]

FINDINGS: The heart size and mediastinal contours are within normal limits.
Left lung is clear. Mild right basilar atelectasis or infiltrate is
noted with small right pleural effusion. The visualized skeletal
structures are unremarkable.
IMPRESSION: Stable small right pleural effusion with associated atelectasis or
infiltrate.

## 2022-04-09 DIAGNOSIS — Z5181 Encounter for therapeutic drug level monitoring: Secondary | ICD-10-CM | POA: Diagnosis not present

## 2022-04-09 DIAGNOSIS — I5032 Chronic diastolic (congestive) heart failure: Secondary | ICD-10-CM | POA: Diagnosis not present

## 2022-04-09 DIAGNOSIS — Z86711 Personal history of pulmonary embolism: Secondary | ICD-10-CM | POA: Diagnosis not present

## 2022-04-09 DIAGNOSIS — Z79899 Other long term (current) drug therapy: Secondary | ICD-10-CM | POA: Diagnosis not present

## 2022-04-09 DIAGNOSIS — E7800 Pure hypercholesterolemia, unspecified: Secondary | ICD-10-CM | POA: Diagnosis not present

## 2022-04-09 DIAGNOSIS — I480 Paroxysmal atrial fibrillation: Secondary | ICD-10-CM | POA: Diagnosis not present

## 2022-04-16 DIAGNOSIS — Z86711 Personal history of pulmonary embolism: Secondary | ICD-10-CM | POA: Diagnosis not present

## 2022-04-16 DIAGNOSIS — E7800 Pure hypercholesterolemia, unspecified: Secondary | ICD-10-CM | POA: Diagnosis not present

## 2022-04-16 DIAGNOSIS — I480 Paroxysmal atrial fibrillation: Secondary | ICD-10-CM | POA: Diagnosis not present

## 2022-04-16 DIAGNOSIS — I5032 Chronic diastolic (congestive) heart failure: Secondary | ICD-10-CM | POA: Diagnosis not present

## 2022-04-16 DIAGNOSIS — Z5181 Encounter for therapeutic drug level monitoring: Secondary | ICD-10-CM | POA: Diagnosis not present

## 2022-04-23 DIAGNOSIS — N1832 Chronic kidney disease, stage 3b: Secondary | ICD-10-CM | POA: Diagnosis not present

## 2022-05-02 DIAGNOSIS — I150 Renovascular hypertension: Secondary | ICD-10-CM | POA: Diagnosis not present

## 2022-05-02 DIAGNOSIS — N1832 Chronic kidney disease, stage 3b: Secondary | ICD-10-CM | POA: Diagnosis not present

## 2022-06-06 DIAGNOSIS — R911 Solitary pulmonary nodule: Secondary | ICD-10-CM | POA: Diagnosis not present

## 2022-06-06 DIAGNOSIS — J4541 Moderate persistent asthma with (acute) exacerbation: Secondary | ICD-10-CM | POA: Diagnosis not present

## 2022-06-06 DIAGNOSIS — I5032 Chronic diastolic (congestive) heart failure: Secondary | ICD-10-CM | POA: Diagnosis not present

## 2022-08-23 ENCOUNTER — Encounter (HOSPITAL_COMMUNITY): Payer: Self-pay | Admitting: *Deleted

## 2022-09-18 DIAGNOSIS — Z0000 Encounter for general adult medical examination without abnormal findings: Secondary | ICD-10-CM | POA: Diagnosis not present

## 2022-09-18 DIAGNOSIS — E663 Overweight: Secondary | ICD-10-CM | POA: Diagnosis not present

## 2022-09-18 DIAGNOSIS — R7303 Prediabetes: Secondary | ICD-10-CM | POA: Diagnosis not present

## 2022-09-18 DIAGNOSIS — R7309 Other abnormal glucose: Secondary | ICD-10-CM | POA: Diagnosis not present

## 2022-09-18 DIAGNOSIS — I700 Atherosclerosis of aorta: Secondary | ICD-10-CM | POA: Diagnosis not present

## 2022-09-18 DIAGNOSIS — N1831 Chronic kidney disease, stage 3a: Secondary | ICD-10-CM | POA: Diagnosis not present

## 2022-09-18 DIAGNOSIS — I480 Paroxysmal atrial fibrillation: Secondary | ICD-10-CM | POA: Diagnosis not present

## 2022-09-18 DIAGNOSIS — I10 Essential (primary) hypertension: Secondary | ICD-10-CM | POA: Diagnosis not present

## 2022-09-18 DIAGNOSIS — M8588 Other specified disorders of bone density and structure, other site: Secondary | ICD-10-CM | POA: Diagnosis not present

## 2022-09-18 DIAGNOSIS — E7800 Pure hypercholesterolemia, unspecified: Secondary | ICD-10-CM | POA: Diagnosis not present

## 2022-09-21 DIAGNOSIS — I150 Renovascular hypertension: Secondary | ICD-10-CM | POA: Diagnosis not present

## 2022-09-21 DIAGNOSIS — N1832 Chronic kidney disease, stage 3b: Secondary | ICD-10-CM | POA: Diagnosis not present

## 2022-09-23 ENCOUNTER — Emergency Department (HOSPITAL_COMMUNITY): Payer: Medicare PPO

## 2022-09-23 ENCOUNTER — Observation Stay (HOSPITAL_COMMUNITY)
Admission: EM | Admit: 2022-09-23 | Discharge: 2022-09-24 | Disposition: A | Discharge status: Home / Self Care | Payer: Medicare PPO | Attending: Internal Medicine | Admitting: Internal Medicine

## 2022-09-23 ENCOUNTER — Other Ambulatory Visit: Payer: Self-pay

## 2022-09-23 ENCOUNTER — Encounter (HOSPITAL_COMMUNITY): Payer: Self-pay

## 2022-09-23 DIAGNOSIS — R002 Palpitations: Secondary | ICD-10-CM | POA: Diagnosis present

## 2022-09-23 DIAGNOSIS — I129 Hypertensive chronic kidney disease with stage 1 through stage 4 chronic kidney disease, or unspecified chronic kidney disease: Secondary | ICD-10-CM | POA: Diagnosis not present

## 2022-09-23 DIAGNOSIS — J449 Chronic obstructive pulmonary disease, unspecified: Secondary | ICD-10-CM | POA: Insufficient documentation

## 2022-09-23 DIAGNOSIS — I471 Supraventricular tachycardia, unspecified: Secondary | ICD-10-CM | POA: Diagnosis present

## 2022-09-23 DIAGNOSIS — R509 Fever, unspecified: Secondary | ICD-10-CM | POA: Insufficient documentation

## 2022-09-23 DIAGNOSIS — Z1152 Encounter for screening for COVID-19: Secondary | ICD-10-CM | POA: Insufficient documentation

## 2022-09-23 DIAGNOSIS — Z79899 Other long term (current) drug therapy: Secondary | ICD-10-CM | POA: Insufficient documentation

## 2022-09-23 DIAGNOSIS — I480 Paroxysmal atrial fibrillation: Principal | ICD-10-CM

## 2022-09-23 DIAGNOSIS — N182 Chronic kidney disease, stage 2 (mild): Secondary | ICD-10-CM | POA: Insufficient documentation

## 2022-09-23 DIAGNOSIS — R000 Tachycardia, unspecified: Secondary | ICD-10-CM | POA: Diagnosis not present

## 2022-09-23 DIAGNOSIS — I447 Left bundle-branch block, unspecified: Secondary | ICD-10-CM | POA: Diagnosis not present

## 2022-09-23 DIAGNOSIS — R531 Weakness: Secondary | ICD-10-CM | POA: Diagnosis not present

## 2022-09-23 DIAGNOSIS — I4891 Unspecified atrial fibrillation: Secondary | ICD-10-CM | POA: Diagnosis not present

## 2022-09-23 DIAGNOSIS — Z7901 Long term (current) use of anticoagulants: Secondary | ICD-10-CM | POA: Diagnosis not present

## 2022-09-23 DIAGNOSIS — R0602 Shortness of breath: Secondary | ICD-10-CM | POA: Diagnosis not present

## 2022-09-23 DIAGNOSIS — I10 Essential (primary) hypertension: Secondary | ICD-10-CM | POA: Diagnosis present

## 2022-09-23 LAB — COMPREHENSIVE METABOLIC PANEL
ALT: 21 U/L (ref 0–44)
AST: 28 U/L (ref 15–41)
Albumin: 4 g/dL (ref 3.5–5.0)
Alkaline Phosphatase: 83 U/L (ref 38–126)
Anion gap: 9 (ref 5–15)
BUN: 28 mg/dL — ABNORMAL HIGH (ref 8–23)
CO2: 26 mmol/L (ref 22–32)
Calcium: 10.3 mg/dL (ref 8.9–10.3)
Chloride: 101 mmol/L (ref 98–111)
Creatinine, Ser: 1.91 mg/dL — ABNORMAL HIGH (ref 0.44–1.00)
GFR, Estimated: 26 mL/min — ABNORMAL LOW (ref >60)
Glucose, Bld: 102 mg/dL — ABNORMAL HIGH (ref 70–99)
Potassium: 4.8 mmol/L (ref 3.5–5.1)
Sodium: 136 mmol/L (ref 135–145)
Total Bilirubin: 0.8 mg/dL (ref 0.3–1.2)
Total Protein: 7.8 g/dL (ref 6.5–8.1)

## 2022-09-23 LAB — MAGNESIUM: Magnesium: 2.2 mg/dL (ref 1.7–2.4)

## 2022-09-23 LAB — URINALYSIS, ROUTINE W REFLEX MICROSCOPIC
Bilirubin Urine: NEGATIVE
Glucose, UA: NEGATIVE mg/dL
Hgb urine dipstick: NEGATIVE
Ketones, ur: NEGATIVE mg/dL
Leukocytes,Ua: NEGATIVE
Nitrite: NEGATIVE
Protein, ur: NEGATIVE mg/dL
Specific Gravity, Urine: 1.004 — ABNORMAL LOW (ref 1.005–1.030)
pH: 5 (ref 5.0–8.0)

## 2022-09-23 LAB — CBC WITH DIFFERENTIAL/PLATELET
Abs Immature Granulocytes: 0.02 10*3/uL (ref 0.00–0.07)
Basophils Absolute: 0 10*3/uL (ref 0.0–0.1)
Basophils Relative: 0 %
Eosinophils Absolute: 0.1 10*3/uL (ref 0.0–0.5)
Eosinophils Relative: 1 %
HCT: 45.1 % (ref 36.0–46.0)
Hemoglobin: 14.2 g/dL (ref 12.0–15.0)
Immature Granulocytes: 0 %
Lymphocytes Relative: 20 %
Lymphs Abs: 1.4 10*3/uL (ref 0.7–4.0)
MCH: 25 pg — ABNORMAL LOW (ref 26.0–34.0)
MCHC: 31.5 g/dL (ref 30.0–36.0)
MCV: 79.4 fL — ABNORMAL LOW (ref 80.0–100.0)
Monocytes Absolute: 0.5 10*3/uL (ref 0.1–1.0)
Monocytes Relative: 7 %
Neutro Abs: 4.9 10*3/uL (ref 1.7–7.7)
Neutrophils Relative %: 72 %
Platelets: 282 10*3/uL (ref 150–400)
RBC: 5.68 MIL/uL — ABNORMAL HIGH (ref 3.87–5.11)
RDW: 14.6 % (ref 11.5–15.5)
WBC: 7 10*3/uL (ref 4.0–10.5)
nRBC: 0 % (ref 0.0–0.2)

## 2022-09-23 LAB — TROPONIN I (HIGH SENSITIVITY)
Troponin I (High Sensitivity): 27 ng/L — ABNORMAL HIGH (ref <18)
Troponin I (High Sensitivity): 26 ng/L — ABNORMAL HIGH (ref <18)

## 2022-09-23 LAB — LACTIC ACID, PLASMA
Lactic Acid, Venous: 1.2 mmol/L (ref 0.5–1.9)
Lactic Acid, Venous: 2.4 mmol/L (ref 0.5–1.9)

## 2022-09-23 LAB — T4, FREE: Free T4: 0.96 ng/dL (ref 0.61–1.12)

## 2022-09-23 LAB — PHOSPHORUS: Phosphorus: 2.9 mg/dL (ref 2.5–4.6)

## 2022-09-23 LAB — BRAIN NATRIURETIC PEPTIDE: B Natriuretic Peptide: 179 pg/mL — ABNORMAL HIGH (ref 0.0–100.0)

## 2022-09-23 LAB — RESP PANEL BY RT-PCR (RSV, FLU A&B, COVID)  RVPGX2
Influenza A by PCR: NEGATIVE
Influenza B by PCR: NEGATIVE
Resp Syncytial Virus by PCR: NEGATIVE
SARS Coronavirus 2 by RT PCR: NEGATIVE

## 2022-09-23 MED ORDER — MOMETASONE FURO-FORMOTEROL FUM 200-5 MCG/ACT IN AERO
2.0000 | INHALATION_SPRAY | Freq: Two times a day (BID) | RESPIRATORY_TRACT | Status: DC
  Administered 2022-09-24: 2 via RESPIRATORY_TRACT
  Filled 2022-09-23: qty 8.8

## 2022-09-23 MED ORDER — MONTELUKAST SODIUM 10 MG PO TABS: 10.0000 mg | ORAL_TABLET | Freq: Every day | ORAL | Status: DC

## 2022-09-23 MED ORDER — ONDANSETRON HCL 4 MG/2ML IJ SOLN: 4.0000 mg | Freq: Four times a day (QID) | INTRAMUSCULAR | Status: DC | PRN

## 2022-09-23 MED ORDER — AMIODARONE HCL IN DEXTROSE 360-4.14 MG/200ML-% IV SOLN: 30.0000 mg/h | INTRAVENOUS | Status: DC

## 2022-09-23 MED ORDER — SODIUM CHLORIDE 0.9 % IV BOLUS
1000.0000 mL | Freq: Once | INTRAVENOUS | Status: AC
  Administered 2022-09-23: 1000 mL via INTRAVENOUS

## 2022-09-23 MED ORDER — METOPROLOL TARTRATE 5 MG/5ML IV SOLN: 5.0000 mg | INTRAVENOUS | Status: DC | PRN

## 2022-09-23 MED ORDER — APIXABAN 5 MG PO TABS
5.0000 mg | ORAL_TABLET | Freq: Two times a day (BID) | ORAL | Status: DC
  Administered 2022-09-23 – 2022-09-24 (×2): 5 mg via ORAL
  Filled 2022-09-23 (×2): qty 1

## 2022-09-23 MED ORDER — AMIODARONE HCL IN DEXTROSE 360-4.14 MG/200ML-% IV SOLN: 60.0000 mg/h | INTRAVENOUS | Status: DC

## 2022-09-23 MED ORDER — SODIUM CHLORIDE 0.9% FLUSH
3.0000 mL | Freq: Two times a day (BID) | INTRAVENOUS | Status: DC
  Administered 2022-09-23 – 2022-09-24 (×2): 3 mL via INTRAVENOUS

## 2022-09-23 MED ORDER — IPRATROPIUM BROMIDE 0.02 % IN SOLN: 0.5000 mg | Freq: Four times a day (QID) | RESPIRATORY_TRACT | Status: DC | PRN

## 2022-09-23 MED ORDER — LORATADINE 10 MG PO TABS
10.0000 mg | ORAL_TABLET | Freq: Every day | ORAL | Status: DC
  Administered 2022-09-24: 10 mg via ORAL
  Filled 2022-09-23: qty 1

## 2022-09-23 MED ORDER — DIPHENHYDRAMINE HCL 50 MG/ML IJ SOLN: 25.0000 mg | Freq: Four times a day (QID) | INTRAMUSCULAR | Status: DC | PRN

## 2022-09-23 MED ORDER — METOPROLOL TARTRATE 25 MG PO TABS
37.5000 mg | ORAL_TABLET | Freq: Two times a day (BID) | ORAL | Status: DC
  Administered 2022-09-23 – 2022-09-24 (×2): 37.5 mg via ORAL
  Filled 2022-09-23: qty 1
  Filled 2022-09-23: qty 2

## 2022-09-23 MED ORDER — SODIUM CHLORIDE 0.9 % IV SOLN: 250.0000 mL | INTRAVENOUS | Status: DC | PRN

## 2022-09-23 MED ORDER — ACETAMINOPHEN 325 MG PO TABS
650.0000 mg | ORAL_TABLET | ORAL | Status: DC | PRN
  Administered 2022-09-23: 650 mg via ORAL
  Filled 2022-09-23: qty 2

## 2022-09-23 MED ORDER — MONTELUKAST SODIUM 10 MG PO TABS
10.0000 mg | ORAL_TABLET | Freq: Every day | ORAL | Status: DC
  Administered 2022-09-23: 10 mg via ORAL
  Filled 2022-09-23: qty 1

## 2022-09-23 MED ORDER — ATORVASTATIN CALCIUM 40 MG PO TABS: 40.0000 mg | ORAL_TABLET | Freq: Every day | ORAL | Status: DC

## 2022-09-23 MED ORDER — SODIUM CHLORIDE 0.9% FLUSH: 3.0000 mL | INTRAVENOUS | Status: DC | PRN

## 2022-09-23 MED ORDER — DILTIAZEM HCL-DEXTROSE 125-5 MG/125ML-% IV SOLN (PREMIX): 5.0000 mg/h | INTRAVENOUS | Status: DC

## 2022-09-23 MED ORDER — AMIODARONE HCL 200 MG PO TABS
200.0000 mg | ORAL_TABLET | Freq: Every day | ORAL | Status: DC
  Administered 2022-09-23 – 2022-09-24 (×2): 200 mg via ORAL
  Filled 2022-09-23 (×2): qty 1

## 2022-09-23 MED ORDER — DILTIAZEM LOAD VIA INFUSION
10.0000 mg | Freq: Once | INTRAVENOUS | Status: DC
  Filled 2022-09-23: qty 10

## 2022-09-23 MED ORDER — DILTIAZEM HCL 25 MG/5ML IV SOLN
10.0000 mg | Freq: Once | INTRAVENOUS | Status: AC
  Administered 2022-09-23: 10 mg via INTRAVENOUS
  Filled 2022-09-23: qty 5

## 2022-09-23 MED ORDER — AMIODARONE HCL 200 MG PO TABS: 200.0000 mg | ORAL_TABLET | Freq: Every day | ORAL | Status: DC

## 2022-09-23 NOTE — ED Provider Notes (Signed)
[facility] Provider Note   CSN: [id number] Arrival date & time: 09/23/22  1118     History  Chief Complaint  Patient presents with   Weakness    Helen Stanton is a 77 y.o. female.   Weakness    Patient with medical history of hypertension, hyperlipidemia, PAF on Eliquis presents to the emergency department due to generalized weakness and hypotension.  Patient states she checks her blood pressure every morning, was hypotensive with systolic in the [id number] today prompting ED visit.  States she feels weak all over and has been having shortness of breath, had fever yesterday.  Denies any chest pain, nausea, cough, vomiting, dysuria, increased urinary frequency, confusion, changes in medication doses.  She did take her blood pressure medicine this morning.  Home Medications Prior to Admission medications   Medication Sig Start Date End Date Taking? Authorizing Provider  albuterol (PROVENTIL HFA;VENTOLIN HFA) 108 (90 BASE) MCG/ACT inhaler Inhale 2 puffs into the lungs every 6 (six) hours as needed for wheezing.    [provider]  amiodarone (PACERONE) 200 MG tablet Take 1 tablet (200 mg total) by mouth daily. 03/24/21   [name], NP  apixaban (ELIQUIS) 5 MG TABS tablet Take 2 tablets twice daily until 02/22/21 then reduce to 1 tablet twice daily 02/16/21   [name], MD  atorvastatin (LIPITOR) 40 MG tablet Take 1 tablet by mouth daily. 02/27/21 02/27/22  [provider]  calcium carbonate (OS-CAL - DOSED IN MG OF ELEMENTAL CALCIUM) 1250 MG tablet Take 1 tablet by mouth daily.    [provider]  cetirizine (ZYRTEC) 10 MG tablet Take 10 mg by mouth daily.    [provider]  Cholecalciferol (VITAMIN D) 50 MCG (2000 UT) CAPS Take 2,000 Units by mouth daily.    [provider]  diltiazem (CARDIZEM CD) 120 MG 24 hr capsule Take 1 capsule (120 mg total) by mouth daily. 02/17/21 03/19/21  [name], [name], MD   Fluticasone-Salmeterol (ADVAIR) 250-50 MCG/DOSE AEPB Inhale 1 puff into the lungs every 12 (twelve) hours.    [provider]  montelukast (SINGULAIR) 10 MG tablet Take 1 tablet by mouth daily. Take 1 tablet by mouth once daily 11/30/20   [provider]  Multiple Vitamin (MULTIVITAMIN WITH MINERALS) TABS Take 1 tablet by mouth daily.    [provider]      Allergies    Fish allergy, Glucosamine, Milk-related compounds, and Diltiazem hcl    Review of Systems   Review of Systems  Neurological:  Positive for weakness.    Physical Exam Updated Vital Signs BP 107/64   Pulse (!) 115   Temp 98 F (36.7 C)   Resp (!) 24   Ht '5\' 1"'$  (1.549 m)   Wt 76.2 kg   SpO2 100%   BMI 31.74 kg/m  Physical Exam Vitals and nursing note reviewed. Exam conducted with a chaperone present.  Constitutional:      Appearance: Normal appearance.  HENT:     Head: Normocephalic and atraumatic.  Eyes:     General: No scleral icterus.       Right eye: No discharge.        Left eye: No discharge.     Extraocular Movements: Extraocular movements intact.     Pupils: Pupils are equal, round, and reactive to light.  Cardiovascular:     Rate and Rhythm: Regular rhythm. Tachycardia present.     Pulses: Normal pulses.  Heart sounds: Normal heart sounds.     No friction rub. No gallop.  Pulmonary:     Effort: Pulmonary effort is normal. No respiratory distress.     Breath sounds: Normal breath sounds.  Abdominal:     General: Abdomen is flat. Bowel sounds are normal. There is no distension.     Palpations: Abdomen is soft.     Tenderness: There is no abdominal tenderness.  Skin:    General: Skin is warm and dry.     Coloration: Skin is not jaundiced.  Neurological:     Mental Status: She is alert. Mental status is at baseline.     Coordination: Coordination normal.     ED Results / Procedures / Treatments   Labs (all labs ordered are listed, but only abnormal results  are displayed) Labs Reviewed  CBC WITH DIFFERENTIAL/PLATELET - Abnormal; Notable for the following components:      Result Value   RBC 5.68 (*)    MCV 79.4 (*)    MCH 25.0 (*)    All other components within normal limits  COMPREHENSIVE METABOLIC PANEL - Abnormal; Notable for the following components:   Glucose, Bld 102 (*)    BUN 28 (*)    Creatinine, Ser 1.91 (*)    GFR, Estimated 26 (*)    All other components within normal limits  BRAIN NATRIURETIC PEPTIDE - Abnormal; Notable for the following components:   B Natriuretic Peptide 179.0 (*)    All other components within normal limits  LACTIC ACID, PLASMA - Abnormal; Notable for the following components:   Lactic Acid, Venous 2.4 (*)    All other components within normal limits  URINALYSIS, ROUTINE W REFLEX MICROSCOPIC - Abnormal; Notable for the following components:   Color, Urine COLORLESS (*)    Specific Gravity, Urine 1.004 (*)    All other components within normal limits  TROPONIN I (HIGH SENSITIVITY) - Abnormal; Notable for the following components:   Troponin I (High Sensitivity) 27 (*)    All other components within normal limits  TROPONIN I (HIGH SENSITIVITY) - Abnormal; Notable for the following components:   Troponin I (High Sensitivity) 26 (*)    All other components within normal limits  RESP PANEL BY RT-PCR (RSV, FLU A&B, COVID)  [id number]  LACTIC ACID, PLASMA  TSH  T4, FREE  MAGNESIUM  PHOSPHORUS  BASIC METABOLIC PANEL  CBG MONITORING, ED    EKG EKG Interpretation  Date/Time:  Sunday September 23 2022 11:33:10 EDT Ventricular Rate:  114 PR Interval:  284 QRS Duration: 121 QT Interval:  368 QTC Calculation: 507 R Axis:   88 Text Interpretation: Sinus tachycardia Prolonged PR interval Right atrial enlargement Left bundle branch block No significant change since last tracing Confirmed by Varney Biles [id number]) on 09/23/2022 12:19:41 PM  Radiology DG Chest Portable 1 View  Result Date: 09/23/2022 CLINICAL  DATA:  Weakness. EXAM: PORTABLE CHEST 1 VIEW COMPARISON:  02/22/2021 FINDINGS: The heart size and mediastinal contours are within normal limits. Both lungs are clear. The visualized skeletal structures are unremarkable. IMPRESSION: No active disease. Electronically Signed   By: Nolon Nations M.D.   On: 09/23/2022 12:02    Procedures Procedures    Medications Ordered in ED Medications  metoprolol tartrate (LOPRESSOR) injection 5 mg (has no administration in time range)  atorvastatin (LIPITOR) tablet 40 mg (has no administration in time range)  apixaban (ELIQUIS) tablet 5 mg (has no administration in time range)  loratadine (CLARITIN) tablet 10 mg (has  no administration in time range)  mometasone-formoterol (DULERA) 200-5 MCG/ACT inhaler 2 puff (has no administration in time range)  montelukast (SINGULAIR) tablet 10 mg (has no administration in time range)  ipratropium (ATROVENT) nebulizer solution 0.5 mg (has no administration in time range)  sodium chloride flush (NS) 0.9 % injection 3 mL (has no administration in time range)  sodium chloride flush (NS) 0.9 % injection 3 mL (has no administration in time range)  0.9 %  sodium chloride infusion (has no administration in time range)  acetaminophen (TYLENOL) tablet 650 mg (has no administration in time range)  ondansetron (ZOFRAN) injection 4 mg (has no administration in time range)  metoprolol tartrate (LOPRESSOR) tablet 37.5 mg (37.5 mg Oral Given 09/23/22 1719)  diltiazem (CARDIZEM) 1 mg/mL load via infusion 10 mg (has no administration in time range)  amiodarone (PACERONE) tablet 200 mg (200 mg Oral Given 09/23/22 1718)  diphenhydrAMINE (BENADRYL) injection 25 mg (has no administration in time range)  sodium chloride 0.9 % bolus 1,000 mL (has no administration in time range)    ED Course/ Medical Decision Making/ A&P Clinical Course as of 09/23/22 1723  Sun Sep 23, 2022  1440 Comprehensive metabolic panel(!) Renal function is roughly  baseline per chart review, creatinine 1.91.  No gross electrolyte derangement [HS]  1441 Urinalysis, Routine w reflex microscopic -Urine, Clean Catch(!) Within normal limits, no UTI [HS]  1441 Lactic acid, plasma Low, reassuring [HS]  1441 Troponin I (High Sensitivity)(!) Slightly elevated, per chart review patient historically has had somewhat mild troponin elevation.  Will collect second troponin given reported shortness of breath. [HS]  [id number] Brain natriuretic peptide(!) Mildly elevated [HS]    Clinical Course User Index [HS] Sherrill Raring, PA-C                             Medical Decision Making Amount and/or Complexity of Data Reviewed Labs: ordered. Decision-making details documented in ED Course. Radiology: ordered.  Risk Decision regarding hospitalization.   Patient presents to the emergency department due to weakness and low blood pressure.  Differential includes sepsis, hypotension symptomatic, symptomatic atrial fibrillation, arrhythmia, electrolyte derangement, AKI, UTI, pneumonia.  I ordered, viewed interpreted laboratory workup as documented ED course.   Presentation is not consistent with sepsis, there is no gross electrolyte derangement or indications of dehydration.  Patient does remain tachycardic throughout ED visit and atrial fibrillation on cardiac monitoring.  Her blood pressure has been soft although not patently hypotensive.  She is very symptomatic with ambulation.  Patient will need admission for symptomatic atrial fibrillation, I consulted the hospitalist who agrees with admission.  Discussed HPI, physical exam and plan of care for this patient with attending Ankit Nanavati. The attending physician evaluated this patient as part of a shared visit and agrees with plan of care.         Final Clinical Impression(s) / ED Diagnoses Final diagnoses:  Atrial fibrillation with RVR [facility])    Rx / DC Orders ED Discharge Orders     None         Sherrill Raring, PA-C 09/23/22 Warnell Bureau, MD 09/26/22 1531

## 2022-09-23 NOTE — H&P (Signed)
History and Physical    Helen Stanton [id number] DOB: Jan 27, 1946 DOA: 09/23/2022  PCP: Seward Carol, MD (Confirm with patient/family/NH records and if not entered, this has to be entered at [facility] point of entry) Patient coming from: Home  I have personally briefly reviewed patient's old medical records in [place]  Chief Complaint: Palpitations, lightheadedness  HPI: Helen Stanton is a 77 y.o. female with medical history significant of paroxysmal SVT/A-fib on Eliquis, HTN, CKD stage II, COPD, presented with palpitations and generalized weakness and lightheadedness.  Patient was diagnosed with paroxysmal SVT 10 years ago, most occasions recently her heart rate has been controlled.  She measures her blood pressure and heart rate every morning and appears that last week, 2 days ago she had heart rate in the range of 130-140s on 2 readings.  Yesterday appears that her heart rate has been back to normal 70s.  However she does not remember any palpitations, weakness or shortness of breath on Friday.  This morning, patient woke up with strong feeling of palpitations and general) and lightheadedness.  EMS was called and on arrival, it was found found patient blood pressure 82/65 and significant tachycardia.  Patient lives in [place] but visit cardiology once a year in [place] and last visit was March last year.  She used to be on Cardizem for rate control however discontinued due to rash but she could not remember how severe the rash is but she reported that she never had " any known severe reactions to Cardizem". ED Course: Heart rate 114-116 regular, EKG showed sinus tach vs SVT.  K4.8 creatinine 1.9 compared to baseline 1.2 about 1 year ago.  K4.8  Review of Systems: As per HPI otherwise 14 point review of systems negative.    Past Medical History:  Diagnosis Date   Asthma    Atrial fibrillation [name])    Atrial fibrillation with RVR ([place]) 02/14/2021   History of PSVT  (paroxysmal supraventricular tachycardia)    HTN (hypertension) 01/08/2012   Hypercholesterolemia    Hyperlipidemia    Hypertension    Medication management 06/04/2013   Osteopenia of spine    Pulmonary embolism ([place]) 02/14/2021   Pulmonary nodule    SVT (supraventricular tachycardia)     Past Surgical History:  Procedure Laterality Date   [place]     reports that she has never smoked. She has never used smokeless tobacco. She reports that she does not drink alcohol and does not use drugs.  Allergies  Allergen Reactions   Fish Allergy Shortness Of Breath   Glucosamine Shortness Of Breath   Milk-Related Compounds Shortness Of Breath   Diltiazem Hcl Rash    Family History  Problem Relation Age of Onset   Hypertension Mother    Dementia Mother    Hypertension Father    Dementia Father    Diabetes Father      Prior to Admission medications   Medication Sig Start Date End Date Taking? Authorizing Provider  albuterol (PROVENTIL HFA;VENTOLIN HFA) 108 (90 BASE) MCG/ACT inhaler Inhale 2 puffs into the lungs every 6 (six) hours as needed for wheezing.    [provider]  amiodarone (PACERONE) 200 MG tablet Take 1 tablet (200 mg total) by mouth daily. 03/24/21   [name], NP  apixaban (ELIQUIS) 5 MG TABS tablet Take 2 tablets twice daily until 02/22/21 then reduce to 1 tablet twice daily 02/16/21   [name], MD  atorvastatin (LIPITOR) 40 MG tablet Take  1 tablet by mouth daily. 02/27/21 02/27/22  [provider]  calcium carbonate (OS-CAL - DOSED IN MG OF ELEMENTAL CALCIUM) 1250 MG tablet Take 1 tablet by mouth daily.    [provider]  cetirizine (ZYRTEC) 10 MG tablet Take 10 mg by mouth daily.    [provider]  Cholecalciferol (VITAMIN D) 50 MCG (2000 UT) CAPS Take 2,000 Units by mouth daily.    [provider]  diltiazem (CARDIZEM CD) 120 MG 24 hr capsule Take 1 capsule (120 mg total) by mouth daily. 02/17/21  03/19/21  [name], [name], MD  Fluticasone-Salmeterol (ADVAIR) 250-50 MCG/DOSE AEPB Inhale 1 puff into the lungs every 12 (twelve) hours.    [provider]  montelukast (SINGULAIR) 10 MG tablet Take 1 tablet by mouth daily. Take 1 tablet by mouth once daily 11/30/20   [provider]  Multiple Vitamin (MULTIVITAMIN WITH MINERALS) TABS Take 1 tablet by mouth daily.    [provider]    Physical Exam: Vitals:   09/23/22 1535 09/23/22 1600 09/23/22 1615 09/23/22 1630  BP:  (!) 138/119 117/79 107/64  Pulse:    (!) 115  Resp:  14 20 (!) 24  Temp: 98 F (36.7 C)     TempSrc:      SpO2:    100%  Weight:      Height:        Constitutional: NAD, calm, comfortable Vitals:   09/23/22 1535 09/23/22 1600 09/23/22 1615 09/23/22 1630  BP:  (!) 138/119 117/79 107/64  Pulse:    (!) 115  Resp:  14 20 (!) 24  Temp: 98 F (36.7 C)     TempSrc:      SpO2:    100%  Weight:      Height:       Eyes: PERRL, lids and conjunctivae normal ENMT: Mucous membranes are moist. Posterior pharynx clear of any exudate or lesions.Normal dentition.  Neck: normal, supple, no masses, no thyromegaly Respiratory: clear to auscultation bilaterally, no wheezing, no crackles. Normal respiratory effort. No accessory muscle use.  Cardiovascular: Regular rate and rhythm, no murmurs / rubs / gallops. No extremity edema. 2+ pedal pulses. No carotid bruits.  Abdomen: no tenderness, no masses palpated. No hepatosplenomegaly. Bowel sounds positive.  Musculoskeletal: no clubbing / cyanosis. No joint deformity upper and lower extremities. Good ROM, no contractures. Normal muscle tone.  Skin: no rashes, lesions, ulcers. No induration Neurologic: CN 2-12 grossly intact. Sensation intact, DTR normal. Strength 5/5 in all 4.  Psychiatric: Normal judgment and insight. Alert and oriented x 3. Normal mood.     Labs on Admission: I have personally reviewed following labs and imaging  studies  CBC: Recent Labs  Lab 09/23/22 1309  WBC 7.0  NEUTROABS 4.9  HGB 14.2  HCT 45.1  MCV 79.4*  PLT [id number]   Basic Metabolic Panel: Recent Labs  Lab 09/23/22 1309  NA 136  K 4.8  CL 101  CO2 26  GLUCOSE 102*  BUN 28*  CREATININE 1.91*  CALCIUM 10.3   GFR: Estimated Creatinine Clearance: 22.3 mL/min (A) (by C-G formula based on SCr of 1.91 mg/dL (H)). Liver Function Tests: Recent Labs  Lab 09/23/22 1309  AST 28  ALT 21  ALKPHOS 83  BILITOT 0.8  PROT 7.8  ALBUMIN 4.0   No results for input(s): "LIPASE", "AMYLASE" in the last 168 hours. No results for input(s): "AMMONIA" in the last 168 hours. Coagulation Profile: No results for input(s): "INR", "PROTIME" in  the last 168 hours. Cardiac Enzymes: No results for input(s): "CKTOTAL", "CKMB", "CKMBINDEX", "TROPONINI" in the last 168 hours. BNP (last 3 results) No results for input(s): "PROBNP" in the last 8760 hours. HbA1C: No results for input(s): "HGBA1C" in the last 72 hours. CBG: No results for input(s): "GLUCAP" in the last 168 hours. Lipid Profile: No results for input(s): "CHOL", "HDL", "LDLCALC", "TRIG", "CHOLHDL", "LDLDIRECT" in the last 72 hours. Thyroid Function Tests: No results for input(s): "TSH", "T4TOTAL", "FREET4", "T3FREE", "THYROIDAB" in the last 72 hours. Anemia Panel: No results for input(s): "VITAMINB12", "FOLATE", "FERRITIN", "TIBC", "IRON", "RETICCTPCT" in the last 72 hours. Urine analysis:    Component Value Date/Time   COLORURINE COLORLESS (A) 09/23/2022 1352   APPEARANCEUR CLEAR 09/23/2022 1352   LABSPEC 1.004 (L) 09/23/2022 1352   PHURINE 5.0 09/23/2022 1352   GLUCOSEU NEGATIVE 09/23/2022 1352   HGBUR NEGATIVE 09/23/2022 1352   BILIRUBINUR NEGATIVE 09/23/2022 1352   KETONESUR NEGATIVE 09/23/2022 1352   PROTEINUR NEGATIVE 09/23/2022 1352   NITRITE NEGATIVE 09/23/2022 1352   LEUKOCYTESUR NEGATIVE 09/23/2022 1352    Radiological Exams on Admission: DG Chest Portable 1  View  Result Date: 09/23/2022 CLINICAL DATA:  Weakness. EXAM: PORTABLE CHEST 1 VIEW COMPARISON:  02/22/2021 FINDINGS: The heart size and mediastinal contours are within normal limits. Both lungs are clear. The visualized skeletal structures are unremarkable. IMPRESSION: No active disease. Electronically Signed   By: Nolon Nations M.D.   On: 09/23/2022 12:02    EKG: Independently reviewed.  Sinus tachycardia versus SVT, secondary nonspecific ST changes  Assessment/Plan Principal Problem:   Afib (HCC) Active Problems:   Atrial fibrillation with RVR (HCC)   Atrial fibrillation (HCC)   Hypertension   Supraventricular tachycardia  (please populate well all problems here in Problem List. (For example, if patient is on BP meds at home and you resume or decide to hold them, it is a problem that needs to be her. Same for CAD, COPD, HLD and so on)  Symptomatic SVT -Discussed with on-call cardiology Dr. Terri Skains, who recommend cardizem injection x1 if allergy symptoms not severe, can try to give Benadryl at the same time of Cardizem injection, and consider Cardizem drip. -Increase metoprolol from 25 twice daily to 37.5 mg twice daily -As needed Lopressor -Cardiology further recommended adenosine, however given the patient history of COPD, will hold off adenosine for now. -Echocardiogram -Will give 1 L IV bolus.  No significant symptoms or signs of active infection denies any cough no urinary symptoms diarrhea.  UA showed no UTI. -TSH, T4, Mg and Phos  Hx of PAF -Discussed with patient EKG and telemetry monitoring record with cardiology, impression is today's tachycardia is caused by SVT/sinus tachycardia but not A-fib -Continue p.o. amiodarone and Eliquis  COPD -Stable, continue ICS/LABA, avoid albuterol  CKD stage II -Euvolemic, increase of creatinine reading compared to last year, 1 dose of 1 L IV bolus ordered.  Recheck BMP tomorrow  DVT prophylaxis: Eliquis Code Status: Full code Family  Communication: Daughter at bedside Disposition Plan: Expect less than 2 midnight hospital stay Consults called: Cardiology Dr. Terri Skains over phone, reconsult cardiology if further help needed Admission status: PCU obs   Lequita Halt MD [facility] Pager [phone]  09/23/2022, 4:51 PM

## 2022-09-23 NOTE — ED Triage Notes (Signed)
Pt reports waking up feeling generally weak this morning, checked her Bp and it was 82/65 then 100/70. Pt also c.o feeling dizzy, denies chest pain or sob at this time. Pt ambulatory

## 2022-09-23 NOTE — Progress Notes (Signed)
ON-CALL CARDIOLOGY 09/23/22  Patient's name: Helen Stanton.   MRN: [id number].    DOB: 1946-01-27 Primary care provider: Seward Carol, MD.  Interaction regarding this patient's care today: Attending physician reached out to cardiology on-call to discuss her presentation.  It appears she has a history of paroxysmal atrial fibrillation for which she is on amiodarone, metoprolol, Eliquis.  However, over the last several days she has been noticing changes in her vitals and finally decided to come to ED for further evaluation.  She has a cardiologist in [place] who she sees with her daughter.  EKG today shows sinus tachycardia with underlying left bundle branch block and ST-T changes likely secondary to LBBB but ischemia cannot be entirely ruled out.  Currently denies any chest pain or heart failure symptoms.  She was seen by cardiology back in July 2013 for SVT responded to adenosine well.  She has a documented allergy to diltiazem -with no known drug reaction to the best of her knowledge.  Impression: Sinus tachycardia Paroxysmal atrial fibrillation Left bundle branch block.  Recommendations: Continue home medications.  I have asked attending physician to investigate if she truly has a diltiazem allergy.  It is not a true allergy and just intolerance a trial of diltiazem drip could be considered for rate control.  Alternative could be to resume home medications with as needed IV metoprolol or verapamil as long as SBP>158mHG.  Continue telemetry  They will call [facility] if formal consult is  requested.  Telephone encounter total time: 15 minutes  SMechele ClaudeFVision Surgical Center Pager: [phone]: [phone]

## 2022-09-23 NOTE — ED Notes (Signed)
ED TO INPATIENT HANDOFF REPORT  ED Nurse Name and Phone #: Hal Hope [phone]  S Name/Age/Gender Helen Stanton 77 y.o. female Room/Bed: 002C/002C  Code Status   Code Status: Full Code  Home/SNF/Other Home Patient oriented to: self, place, time, and situation Is this baseline? Yes   Triage Complete: Triage complete  Chief Complaint Afib [facility]) [I48.91] Supraventricular tachycardia [I47.10]  Triage Note Pt reports waking up feeling generally weak this morning, checked her Bp and it was 82/65 then 100/70. Pt also c.o feeling dizzy, denies chest pain or sob at this time. Pt ambulatory   Allergies Allergies  Allergen Reactions   Fish Allergy Shortness Of Breath   Glucosamine Shortness Of Breath   Milk-Related Compounds Shortness Of Breath   Diltiazem Hcl Rash    Level of Care/Admitting Diagnosis ED Disposition     ED Disposition  Admit   Condition  --   Comment  Hospital Area: [place] [[id number]]  Level of Care: Telemetry Medical [104]  May place patient in observation at [facility] or [place] if equivalent level of care is available:: No  Covid Evaluation: Asymptomatic - no recent exposure (last 10 days) testing not required  Diagnosis: Supraventricular tachycardia [206069]  Admitting Physician: Lequita Halt [id number]  Attending Physician: Lequita Halt [id number]          B Medical/Surgery History Past Medical History:  Diagnosis Date   Asthma    Atrial fibrillation ([place])    Atrial fibrillation with RVR ([place]) 02/14/2021   History of PSVT (paroxysmal supraventricular tachycardia)    HTN (hypertension) 01/08/2012   Hypercholesterolemia    Hyperlipidemia    Hypertension    Medication management 06/04/2013   Osteopenia of spine    Pulmonary embolism ([place]) 02/14/2021   Pulmonary nodule    SVT (supraventricular tachycardia)    Past Surgical History:  Procedure Laterality Date   TUBAL LIGATION  circa 1975     A IV  Location/Drains/Wounds Patient Lines/Drains/Airways Status     Active Line/Drains/Airways     Name Placement date Placement time Site Days   External Urinary Catheter 02/16/21  1900  --  584            Intake/Output Last 24 hours No intake or output data in the 24 hours ending 09/23/22 1651  Labs/Imaging Results for orders placed or performed during the hospital encounter of 09/23/22 (from the past 48 hour(s))  CBC with Differential     Status: Abnormal   Collection Time: 09/23/22  1:09 PM  Result Value Ref Range   WBC 7.0 4.0 - 10.5 K/uL   RBC 5.68 (H) 3.87 - 5.11 MIL/uL   Hemoglobin 14.2 12.0 - 15.0 g/dL   HCT 45.1 36.0 - 46.0 %   MCV 79.4 (L) 80.0 - 100.0 fL   MCH 25.0 (L) 26.0 - 34.0 pg   MCHC 31.5 30.0 - 36.0 g/dL   RDW 14.6 11.5 - 15.5 %   Platelets 282 150 - 400 K/uL   nRBC 0.0 0.0 - 0.2 %   Neutrophils Relative % 72 %   Neutro Abs 4.9 1.7 - 7.7 K/uL   Lymphocytes Relative 20 %   Lymphs Abs 1.4 0.7 - 4.0 K/uL   Monocytes Relative 7 %   Monocytes Absolute 0.5 0.1 - 1.0 K/uL   Eosinophils Relative 1 %   Eosinophils Absolute 0.1 0.0 - 0.5 K/uL   Basophils Relative 0 %   Basophils Absolute 0.0 0.0 - 0.1 K/uL  Immature Granulocytes 0 %   Abs Immature Granulocytes 0.02 0.00 - 0.07 K/uL    Comment: Performed at [facility], [place] [address] [place], [place] [address]  Comprehensive metabolic panel     Status: Abnormal   Collection Time: 09/23/22  1:09 PM  Result Value Ref Range   Sodium 136 135 - 145 mmol/L   Potassium 4.8 3.5 - 5.1 mmol/L   Chloride 101 98 - 111 mmol/L   CO2 26 22 - 32 mmol/L   Glucose, Bld 102 (H) 70 - 99 mg/dL    Comment: Glucose reference range applies only to samples taken after fasting for at least 8 hours.   BUN 28 (H) 8 - 23 mg/dL   Creatinine, Ser 1.91 (H) 0.44 - 1.00 mg/dL   Calcium 10.3 8.9 - 10.3 mg/dL   Total Protein 7.8 6.5 - 8.1 g/dL   Albumin 4.0 3.5 - 5.0 g/dL   AST 28 15 - 41 U/L   ALT 21 0 - 44 U/L   Alkaline  Phosphatase 83 38 - 126 U/L   Total Bilirubin 0.8 0.3 - 1.2 mg/dL   GFR, Estimated 26 (L) >60 mL/min    Comment: (NOTE) Calculated using the CKD-EPI Creatinine Equation (2021)    Anion gap 9 5 - 15    Comment: Performed at [facility] [address], [place], [place] [address]  Troponin I (High Sensitivity)     Status: Abnormal   Collection Time: 09/23/22  1:09 PM  Result Value Ref Range   Troponin I (High Sensitivity) 27 (H) <18 ng/L    Comment: (NOTE) Elevated high sensitivity troponin I (hsTnI) values and significant  changes across serial measurements may suggest ACS but many other  chronic and acute conditions are known to elevate hsTnI results.  Refer to the "Links" section for chest pain algorithms and additional  guidance. Performed at [facility], [place] [address], [place], [place] [address]   Brain natriuretic peptide     Status: Abnormal   Collection Time: 09/23/22  1:09 PM  Result Value Ref Range   B Natriuretic Peptide 179.0 (H) 0.0 - 100.0 pg/mL    Comment: Performed at [facility] [address] [place], [place] [address]  Lactic acid, plasma     Status: None   Collection Time: 09/23/22  1:09 PM  Result Value Ref Range   Lactic Acid, Venous 1.2 0.5 - 1.9 mmol/L    Comment: Performed at [facility] [address] [place],  [address]  Resp panel by RT-PCR (RSV, Flu A&B, Covid) Anterior Nasal Swab     Status: None   Collection Time: 09/23/22  1:09 PM   Specimen: Anterior Nasal Swab  Result Value Ref Range   SARS Coronavirus 2 by RT PCR NEGATIVE NEGATIVE   Influenza A by PCR NEGATIVE NEGATIVE   Influenza B by PCR NEGATIVE NEGATIVE    Comment: (NOTE) The Xpert Xpress SARS-CoV-2/FLU/RSV plus assay is intended as an aid in the diagnosis of influenza from Nasopharyngeal swab specimens and should not be used as a sole basis for treatment. Nasal washings and aspirates are unacceptable for Xpert Xpress  SARS-CoV-2/FLU/RSV testing.  Fact Sheet for Patients: EntrepreneurPulse.com.au  Fact Sheet for Healthcare Providers: IncredibleEmployment.be  This test is not yet approved or cleared by the [place] FDA and has been authorized for detection and/or diagnosis of SARS-CoV-2 by FDA under an Emergency Use Authorization (EUA). This EUA will remain in  effect (meaning this test can be used) for the duration of the COVID-19 declaration under Section 564(b)(1) of the Act, 21 U.S.C. section 360bbb-3(b)(1), unless the authorization is terminated or revoked.     Resp Syncytial Virus by PCR NEGATIVE NEGATIVE    Comment: (NOTE) Fact Sheet for Patients: EntrepreneurPulse.com.au  Fact Sheet for Healthcare Providers: IncredibleEmployment.be  This test is not yet approved or cleared by the [place] FDA and has been authorized for detection and/or diagnosis of SARS-CoV-2 by FDA under an Emergency Use Authorization (EUA). This EUA will remain in effect (meaning this test can be used) for the duration of the COVID-19 declaration under Section 564(b)(1) of the Act, 21 U.S.C. section 360bbb-3(b)(1), unless the authorization is terminated or revoked.  Performed at [facility], [place] [address] [place], [place] [address]   Urinalysis, Routine w reflex microscopic -Urine, Clean Catch     Status: Abnormal   Collection Time: 09/23/22  1:52 PM  Result Value Ref Range   Color, Urine COLORLESS (A) YELLOW   APPearance CLEAR CLEAR   Specific Gravity, Urine 1.004 (L) 1.005 - 1.030   pH 5.0 5.0 - 8.0   Glucose, UA NEGATIVE NEGATIVE mg/dL   Hgb urine dipstick NEGATIVE NEGATIVE   Bilirubin Urine NEGATIVE NEGATIVE   Ketones, ur NEGATIVE NEGATIVE mg/dL   Protein, ur NEGATIVE NEGATIVE mg/dL   Nitrite NEGATIVE NEGATIVE   Leukocytes,Ua NEGATIVE NEGATIVE    Comment: Performed at [facility] [address]  [place], [place] [address]   DG Chest Portable 1 View  Result Date: 09/23/2022 CLINICAL DATA:  Weakness. EXAM: PORTABLE CHEST 1 VIEW COMPARISON:  02/22/2021 FINDINGS: The heart size and mediastinal contours are within normal limits. Both lungs are clear. The visualized skeletal structures are unremarkable. IMPRESSION: No active disease. Electronically Signed   By: Nolon Nations M.D.   On: 09/23/2022 12:02    Pending Labs Unresulted Labs (From admission, onward)     Start     Ordered   09/24/22 [id number]  Basic metabolic panel  Daily,   R     Comments: As Scheduled for 5 days    09/23/22 1632   09/23/22 1630  TSH  Add-on,   AD        09/23/22 1632   09/23/22 1134  Lactic acid, plasma  Now then every 2 hours,   R (with STAT occurrences)      09/23/22 1133            Vitals/Pain Today's Vitals   09/23/22 1535 09/23/22 1600 09/23/22 1615 09/23/22 1630  BP:  (!) 138/119 117/79 107/64  Pulse:    (!) 115  Resp:  14 20 (!) 24  Temp: 98 F (36.7 C)     TempSrc:      SpO2:    100%  Weight:      Height:      PainSc:        Isolation Precautions Airborne and Contact precautions  Medications Medications  metoprolol tartrate (LOPRESSOR) injection 5 mg (has no administration in time range)  atorvastatin (LIPITOR) tablet 40 mg (has no administration in time range)  apixaban (ELIQUIS) tablet 5 mg (has no administration in time range)  loratadine (CLARITIN) tablet 10 mg (has no administration in time range)  mometasone-formoterol (DULERA) 200-5 MCG/ACT inhaler 2 puff (has no administration in time range)  montelukast (SINGULAIR) tablet 10 mg (has no administration in time range)  ipratropium (ATROVENT) nebulizer solution 0.5 mg (has no administration in  time range)  sodium chloride flush (NS) 0.9 % injection 3 mL (has no administration in time range)  sodium chloride flush (NS) 0.9 % injection 3 mL (has no administration in time range)  0.9 %  sodium chloride infusion (has no  administration in time range)  acetaminophen (TYLENOL) tablet 650 mg (has no administration in time range)  ondansetron (ZOFRAN) injection 4 mg (has no administration in time range)  metoprolol tartrate (LOPRESSOR) tablet 37.5 mg (has no administration in time range)  diltiazem (CARDIZEM) 1 mg/mL load via infusion 10 mg (has no administration in time range)  amiodarone (PACERONE) tablet 200 mg (has no administration in time range)    Mobility walks     Focused Assessments Cardiac Assessment Handoff:    Lab Results  Component Value Date   CKTOTAL 169 01/08/2012   CKMB 4.9 (H) 01/08/2012   TROPONINI <0.30 01/08/2012   No results found for: "DDIMER" Does the Patient currently have chest pain? No    R Recommendations: See Admitting Provider Note  Report given to:   Additional Notes:

## 2022-09-23 NOTE — ED Notes (Signed)
Pt ambulated well to bathroom. No complaints of shortness of breath of dizziness.

## 2022-09-23 NOTE — ED Notes (Signed)
IV team at bedside 

## 2022-09-23 NOTE — ED Notes (Signed)
Unable to start IV medications at this time. IV team consult has been placed.

## 2022-09-23 NOTE — ED Notes (Signed)
Multiple IV sticks attempted at this time. Unsuccessful. PA aware of situation.

## 2022-09-24 ENCOUNTER — Observation Stay (HOSPITAL_BASED_OUTPATIENT_CLINIC_OR_DEPARTMENT_OTHER): Payer: Medicare PPO

## 2022-09-24 DIAGNOSIS — I4891 Unspecified atrial fibrillation: Secondary | ICD-10-CM | POA: Diagnosis not present

## 2022-09-24 DIAGNOSIS — I159 Secondary hypertension, unspecified: Secondary | ICD-10-CM | POA: Diagnosis not present

## 2022-09-24 DIAGNOSIS — R000 Tachycardia, unspecified: Secondary | ICD-10-CM

## 2022-09-24 DIAGNOSIS — I5031 Acute diastolic (congestive) heart failure: Secondary | ICD-10-CM | POA: Diagnosis not present

## 2022-09-24 LAB — BASIC METABOLIC PANEL
Anion gap: 10 (ref 5–15)
BUN: 36 mg/dL — ABNORMAL HIGH (ref 8–23)
CO2: 22 mmol/L (ref 22–32)
Calcium: 9.3 mg/dL (ref 8.9–10.3)
Chloride: 104 mmol/L (ref 98–111)
Creatinine, Ser: 2.08 mg/dL — ABNORMAL HIGH (ref 0.44–1.00)
GFR, Estimated: 24 mL/min — ABNORMAL LOW (ref >60)
Glucose, Bld: 98 mg/dL (ref 70–99)
Potassium: 4.9 mmol/L (ref 3.5–5.1)
Sodium: 136 mmol/L (ref 135–145)

## 2022-09-24 LAB — ECHOCARDIOGRAM COMPLETE
AR max vel: 1.74 cm2
AV Area VTI: 1.61 cm2
AV Area mean vel: 1.69 cm2
AV Mean grad: 2.5 mmHg
AV Peak grad: 4.8 mmHg
Ao pk vel: 1.09 m/s
Area-P 1/2: 2.67 cm2
Height: 61 in
S' Lateral: 2.8 cm
Weight: 2620.83 oz

## 2022-09-24 LAB — TSH: TSH: 1.264 u[IU]/mL (ref 0.350–4.500)

## 2022-09-24 MED ORDER — METOPROLOL TARTRATE 37.5 MG PO TABS: 37.5000 mg | ORAL_TABLET | Freq: Two times a day (BID) | ORAL | 2 refills | Status: AC

## 2022-09-24 MED ORDER — APIXABAN 5 MG PO TABS: 5.0000 mg | ORAL_TABLET | Freq: Two times a day (BID) | ORAL | 1 refills | Status: AC

## 2022-09-24 MED ORDER — ORAL CARE MOUTH RINSE: 15.0000 mL | OROMUCOSAL | Status: DC | PRN

## 2022-09-24 NOTE — Progress Notes (Signed)
SATURATION QUALIFICATIONS: (This note is used to comply with regulatory documentation for home oxygen)  Patient Saturations on Room Air at Rest = 96-97%  Patient Saturations on Room Air while Ambulating = 96-97%  Please briefly explain why patient needs home oxygen: satting mid-high 90s on RA

## 2022-09-24 NOTE — Progress Notes (Signed)
PT Cancellation Note  Patient Details Name: Helen Stanton MRN: [id number] DOB: Nov 15, 1945   Cancelled Treatment:    Reason Eval/Treat Not Completed: PT screened, no needs identified, will sign off.  Pt has already demonstrated ability to walk in hallway on room air, has no hypotension to walk and demonstrates ability to walk on hallway without help.  Follow up not needed, reconsult PT if needs change.   Ramond Dial 09/24/2022, 12:36 PM  Mee Hives, PT PhD Acute Rehab Dept. Number: [place] [facility] [place]

## 2022-09-24 NOTE — Progress Notes (Signed)
Heart Failure Navigator Progress Note  Assessed for Heart & Vascular TOC clinic readiness.  Patient does not meet criteria due to [facility] cardiology.   Navigator will sign off at this time.    Agusta Hackenberg, BSN, RN Heart Failure Nurse Navigator Secure Chat Only   

## 2022-09-24 NOTE — Care Management (Signed)
  Transition of Care [facility]) Screening Note   Patient Details  Name: Karysa Heft Date of Birth: 1946-01-08   Transition of Care [facility]) CM/SW Contact:    Bethena Roys, RN Phone Number: 09/24/2022, 4:00 PM    Transition of Care Department [facility]) has reviewed the patient and no TOC needs have been identified at this time. We will continue to monitor patient advancement through interdisciplinary progression rounds. If new patient transition needs arise, please place a TOC consult.

## 2022-09-24 NOTE — Discharge Summary (Signed)
Physician Discharge Summary  Helen Stanton [id number] DOB: 14-Mar-1946 DOA: 09/23/2022  PCP: Seward Carol, MD  Admit date: 09/23/2022 Discharge date: 09/24/2022  Admitted From: Home Disposition: Home  Recommendations for Outpatient Follow-up:  Follow up with PCP in 1-2 weeks Follow-up with cardiology as scheduled  Home Health: None Equipment/Devices: None  Discharge Condition: Stable CODE STATUS: Full Diet recommendation: Low-salt low-fat low-carb diet  Brief/Interim Summary: Helen Stanton is a 77 y.o. female with medical history significant of paroxysmal SVT/A-fib on Eliquis, HTN, CKD stage II, COPD. Patient was diagnosed with paroxysmal SVT 10 years ago.  Previous episodes patient that she felt weak nauseous lightheaded and dizzy.  Heart rate is usually well-controlled per patient as she does follow it religiously at home.  Prior to admission patient had notable palpitations with heart rate in the 130s to 140s.  She began having symptoms of weakness and lightheadedness subsequently presented to the hospital.  Patient admitted as above with notable SVT in the field per EMS, known palpitations and history of SVT/A-fib previously requiring hospitalization.  Cardiology was sidelined at intake for advice, there was discussion on initiating Cardizem bolus and drip versus lidocaine however with increased metoprolol dosing patient's heart rate appears to have improved drastically. Echocardiogram shows EF 50-55% with grade 2 diastolic dysfunction. At this time she has no further symptoms, heart rate is well-controlled and is otherwise stable and agreeable for discharge back home.  Discussed close follow-up with PCP and cardiology in the next 1 to 2 weeks as scheduled.  Discharge Diagnoses:  Principal Problem:   Afib ([place]) Active Problems:   Atrial fibrillation with RVR (HCC)   Atrial fibrillation (HCC)   Hypertension   Supraventricular tachycardia   Sinus tachycardia   LBBB (left bundle  branch block)   Paroxysmal A-fib (HCC)  Symptomatic SVT, resolved -Discussed with on-call cardiology Dr. Terri Skains -appreciate insight and recommendations -Patient's heart rate improved with increased metoprolol from 25 twice daily to 37.5 mg twice daily -Maintains on amiodarone as below -No further need for calcium channel blockers or lidocaine -TSH, T4, Mg and Phos within normal limits   Hx of PAF -Continue home amiodarone and Eliquis   COPD -Stable, continue ICS/LABA, avoid albuterol   CKD stage II -Euvolemic, creatinine stable, this is likely her new baseline, recommend repeat labs with PCP in the next 1 to 2 weeks    Discharge Instructions   Allergies as of 09/24/2022       Reactions   Fish Allergy Shortness Of Breath   Glucosamine Shortness Of Breath   Milk-related Compounds Shortness Of Breath   Shellfish-derived Products Shortness Of Breath, Other (See Comments)   Asthma   Diltiazem Hcl Rash        Medication List     STOP taking these medications    Cartia XT 120 MG 24 hr capsule Generic drug: diltiazem   cetirizine 10 MG tablet Commonly known as: ZYRTEC       TAKE these medications    albuterol 108 (90 Base) MCG/ACT inhaler Commonly known as: VENTOLIN HFA Inhale 2 puffs into the lungs every 6 (six) hours as needed for wheezing or shortness of breath.   amiodarone 200 MG tablet Commonly known as: PACERONE Take 1 tablet (200 mg total) by mouth daily.   apixaban 5 MG Tabs tablet Commonly known as: ELIQUIS Take 1 tablet (5 mg total) by mouth 2 (two) times daily.   atorvastatin 40 MG tablet Commonly known as: LIPITOR Take 1 tablet by mouth daily.  fluticasone 50 MCG/ACT nasal spray Commonly known as: FLONASE Place 1 spray into both nostrils 2 (two) times daily as needed for allergies or rhinitis.   fluticasone-salmeterol 230-21 MCG/ACT inhaler Commonly known as: ADVAIR HFA Inhale 2 puffs into the lungs 2 (two) times daily. What changed:  Another medication with the same name was removed. Continue taking this medication, and follow the directions you see here.   Metoprolol Tartrate 37.5 MG Tabs Take 1 tablet (37.5 mg total) by mouth 2 (two) times daily. What changed:  medication strength how much to take   montelukast 10 MG tablet Commonly known as: SINGULAIR Take 1 tablet by mouth at bedtime.   spironolactone 50 MG tablet Commonly known as: ALDACTONE Take 50 mg by mouth daily.   torsemide 20 MG tablet Commonly known as: DEMADEX Take 20 mg by mouth daily as needed (for edema or fluid retention).   valsartan-hydrochlorothiazide 320-12.5 MG tablet Commonly known as: DIOVAN-HCT Take 1 tablet by mouth daily.        Allergies  Allergen Reactions   Fish Allergy Shortness Of Breath   Glucosamine Shortness Of Breath   Milk-Related Compounds Shortness Of Breath   Shellfish-Derived Products Shortness Of Breath and Other (See Comments)    Asthma    Diltiazem Hcl Rash    Consultations: None  Procedures/Studies: DG Chest Portable 1 View  Result Date: 09/23/2022 CLINICAL DATA:  Weakness. EXAM: PORTABLE CHEST 1 VIEW COMPARISON:  02/22/2021 FINDINGS: The heart size and mediastinal contours are within normal limits. Both lungs are clear. The visualized skeletal structures are unremarkable. IMPRESSION: No active disease. Electronically Signed   By: Nolon Nations M.D.   On: 09/23/2022 12:02     Subjective: No acute issues or events overnight   Discharge Exam: Vitals:   09/24/22 1006 09/24/22 1256  BP:  (!) 87/52  Pulse: 97 (!) 55  Resp: 18 15  Temp:  (!) 97.5 F (36.4 C)  SpO2: 100% 100%   Vitals:   09/24/22 0858 09/24/22 0900 09/24/22 1006 09/24/22 1256  BP: (!) 100/50 (!) 100/50  (!) 87/52  Pulse: 71  97 (!) 55  Resp:   18 15  Temp:    (!) 97.5 F (36.4 C)  TempSrc:    Oral  SpO2:  100% 100% 100%  Weight:      Height:        General: Pt is alert, awake, not in acute distress Cardiovascular:  RRR, S1/S2 +, no rubs, no gallops Respiratory: CTA bilaterally, no wheezing, no rhonchi Abdominal: Soft, NT, ND, bowel sounds + Extremities: no edema, no cyanosis    The results of significant diagnostics from this hospitalization (including imaging, microbiology, ancillary and laboratory) are listed below for reference.     Microbiology: Recent Results (from the past 240 hour(s))  Resp panel by RT-PCR (RSV, Flu A&B, Covid) Anterior Nasal Swab     Status: None   Collection Time: 09/23/22  1:09 PM   Specimen: Anterior Nasal Swab  Result Value Ref Range Status   SARS Coronavirus 2 by RT PCR NEGATIVE NEGATIVE Final   Influenza A by PCR NEGATIVE NEGATIVE Final   Influenza B by PCR NEGATIVE NEGATIVE Final    Comment: (NOTE) The Xpert Xpress SARS-CoV-2/FLU/RSV plus assay is intended as an aid in the diagnosis of influenza from Nasopharyngeal swab specimens and should not be used as a sole basis for treatment. Nasal washings and aspirates are unacceptable for Xpert Xpress SARS-CoV-2/FLU/RSV testing.  Fact Sheet for Patients: EntrepreneurPulse.com.au  Fact  Sheet for Healthcare Providers: IncredibleEmployment.be  This test is not yet approved or cleared by the [place] and has been authorized for detection and/or diagnosis of SARS-CoV-2 by FDA under an Emergency Use Authorization (EUA). This EUA will remain in effect (meaning this test can be used) for the duration of the COVID-19 declaration under Section 564(b)(1) of the Act, 21 U.S.C. section 360bbb-3(b)(1), unless the authorization is terminated or revoked.     Resp Syncytial Virus by PCR NEGATIVE NEGATIVE Final    Comment: (NOTE) Fact Sheet for Patients: EntrepreneurPulse.com.au  Fact Sheet for Healthcare Providers: IncredibleEmployment.be  This test is not yet approved or cleared by the [place] FDA and has been authorized for detection  and/or diagnosis of SARS-CoV-2 by FDA under an Emergency Use Authorization (EUA). This EUA will remain in effect (meaning this test can be used) for the duration of the COVID-19 declaration under Section 564(b)(1) of the Act, 21 U.S.C. section 360bbb-3(b)(1), unless the authorization is terminated or revoked.  Performed at [facility], [place] [address], [place], [place] [address]      Labs: BNP (last 3 results) Recent Labs    09/23/22 1309  BNP 0000000*   Basic Metabolic Panel: Recent Labs  Lab 09/23/22 1309 09/23/22 1804 09/24/22 0152  NA 136  --  136  K 4.8  --  4.9  CL 101  --  104  CO2 26  --  22  GLUCOSE 102*  --  98  BUN 28*  --  36*  CREATININE 1.91*  --  2.08*  CALCIUM 10.3  --  9.3  MG  --  2.2  --   PHOS  --  2.9  --    Liver Function Tests: Recent Labs  Lab 09/23/22 1309  AST 28  ALT 21  ALKPHOS 83  BILITOT 0.8  PROT 7.8  ALBUMIN 4.0   No results for input(s): "LIPASE", "AMYLASE" in the last 168 hours. No results for input(s): "AMMONIA" in the last 168 hours. CBC: Recent Labs  Lab 09/23/22 1309  WBC 7.0  NEUTROABS 4.9  HGB 14.2  HCT 45.1  MCV 79.4*  PLT 282   Cardiac Enzymes: No results for input(s): "CKTOTAL", "CKMB", "CKMBINDEX", "TROPONINI" in the last 168 hours. BNP: Invalid input(s): "POCBNP" CBG: No results for input(s): "GLUCAP" in the last 168 hours. D-Dimer No results for input(s): "DDIMER" in the last 72 hours. Hgb A1c No results for input(s): "HGBA1C" in the last 72 hours. Lipid Profile No results for input(s): "CHOL", "HDL", "LDLCALC", "TRIG", "CHOLHDL", "LDLDIRECT" in the last 72 hours. Thyroid function studies Recent Labs    09/23/22 1804  TSH 1.264   Anemia work up No results for input(s): "VITAMINB12", "FOLATE", "FERRITIN", "TIBC", "IRON", "RETICCTPCT" in the last 72 hours. Urinalysis    Component Value Date/Time   COLORURINE COLORLESS (A) 09/23/2022 1352   APPEARANCEUR CLEAR 09/23/2022 1352   LABSPEC  1.004 (L) 09/23/2022 1352   PHURINE 5.0 09/23/2022 1352   GLUCOSEU NEGATIVE 09/23/2022 1352   HGBUR NEGATIVE 09/23/2022 1352   BILIRUBINUR NEGATIVE 09/23/2022 1352   KETONESUR NEGATIVE 09/23/2022 1352   PROTEINUR NEGATIVE 09/23/2022 1352   NITRITE NEGATIVE 09/23/2022 1352   LEUKOCYTESUR NEGATIVE 09/23/2022 1352   Sepsis Labs Recent Labs  Lab 09/23/22 1309  WBC 7.0   Microbiology Recent Results (from the past 240 hour(s))  Resp panel by RT-PCR (RSV, Flu A&B, Covid) Anterior Nasal Swab     Status: None   Collection Time: 09/23/22  1:09  PM   Specimen: Anterior Nasal Swab  Result Value Ref Range Status   SARS Coronavirus 2 by RT PCR NEGATIVE NEGATIVE Final   Influenza A by PCR NEGATIVE NEGATIVE Final   Influenza B by PCR NEGATIVE NEGATIVE Final    Comment: (NOTE) The Xpert Xpress SARS-CoV-2/FLU/RSV plus assay is intended as an aid in the diagnosis of influenza from Nasopharyngeal swab specimens and should not be used as a sole basis for treatment. Nasal washings and aspirates are unacceptable for Xpert Xpress SARS-CoV-2/FLU/RSV testing.  Fact Sheet for Patients: EntrepreneurPulse.com.au  Fact Sheet for Healthcare Providers: IncredibleEmployment.be  This test is not yet approved or cleared by the [place] FDA and has been authorized for detection and/or diagnosis of SARS-CoV-2 by FDA under an Emergency Use Authorization (EUA). This EUA will remain in effect (meaning this test can be used) for the duration of the COVID-19 declaration under Section 564(b)(1) of the Act, 21 U.S.C. section 360bbb-3(b)(1), unless the authorization is terminated or revoked.     Resp Syncytial Virus by PCR NEGATIVE NEGATIVE Final    Comment: (NOTE) Fact Sheet for Patients: EntrepreneurPulse.com.au  Fact Sheet for Healthcare Providers: IncredibleEmployment.be  This test is not yet approved or cleared by the [place] FDA and has been authorized for detection and/or diagnosis of SARS-CoV-2 by FDA under an Emergency Use Authorization (EUA). This EUA will remain in effect (meaning this test can be used) for the duration of the COVID-19 declaration under Section 564(b)(1) of the Act, 21 U.S.C. section 360bbb-3(b)(1), unless the authorization is terminated or revoked.  Performed at [facility], [place] [address] [place], [place] [address]      Time coordinating discharge: Over 30 minutes  SIGNED:   Little Ishikawa, DO [facility] 09/24/2022, 3:24 PM Pager   If 7PM-7AM, please contact night-coverage [email or web address]

## 2022-09-24 NOTE — Evaluation (Addendum)
Occupational Therapy Evaluation Patient Details Name: Helen Stanton MRN: [id number] DOB: [birth year] Today's Date: 09/24/2022   History of Present Illness Pt is a 77 y/o F presenting to ED on 3/10 with hypotension and generalized weakness. Admitted for A fib. PMH includes HTN, HLD, PAF on Eliquis, asthma, hypercholesteremia, SVT, osteopenia of the spine.   Clinical Impression   Pt very independent at baseline with ADLs and functional mobility, lives with family who can provide PRN assist. Pt currently needing supervision for ADLs, mod I for bed mobility and supervision for transfers without AD. VSS on RA throughout session. Pt presenting with impairments listed below, however has no acute OT needs at this time, will s/o. Please reconsult if there is a change in pt status.    BP 110/57 (73 ) seated Eob BP 107/55 (70) post ambulation in room  SpO2 96-97% on RA throughout session     Recommendations for follow up therapy are one component of a multi-disciplinary discharge planning process, led by the attending physician.  Recommendations may be updated based on patient status, additional functional criteria and insurance authorization.   Follow Up Recommendations  No OT follow up     Assistance Recommended at Discharge PRN  Patient can return home with the following Assistance with cooking/housework    Functional Status Assessment  Patient has had a recent decline in their functional status and demonstrates the ability to make significant improvements in function in a reasonable and predictable amount of time.  Equipment Recommendations  None recommended by OT    Recommendations for Other Services PT consult     Precautions / Restrictions Precautions Precautions: Fall Restrictions Weight Bearing Restrictions: No      Mobility Bed Mobility Overal bed mobility: Modified Independent                  Transfers Overall transfer level: Needs assistance   Transfers: Sit  to/from Stand Sit to Stand: Supervision                  Balance Overall balance assessment: No apparent balance deficits (not formally assessed)                                         ADL either performed or assessed with clinical judgement   ADL Overall ADL's : Needs assistance/impaired Eating/Feeding: Supervision/ safety   Grooming: Supervision/safety;Oral care   Upper Body Bathing: Supervision/ safety   Lower Body Bathing: Supervison/ safety   Upper Body Dressing : Supervision/safety   Lower Body Dressing: Supervision/safety   Toilet Transfer: Supervision/safety   Toileting- Clothing Manipulation and Hygiene: Supervision/safety   Tub/ Banker: Supervision/safety   Functional mobility during ADLs: Supervision/safety       Vision   Vision Assessment?: No apparent visual deficits     Agricultural engineer Tested?: No   Praxis Praxis Praxis tested?: Not tested    Pertinent Vitals/Pain Pain Assessment Pain Assessment: No/denies pain     Hand Dominance Right   Extremity/Trunk Assessment Upper Extremity Assessment Upper Extremity Assessment: Overall WFL for tasks assessed   Lower Extremity Assessment Lower Extremity Assessment: Overall WFL for tasks assessed   Cervical / Trunk Assessment Cervical / Trunk Assessment: Normal   Communication Communication Communication: No difficulties   Cognition Arousal/Alertness: Awake/alert Behavior During Therapy: WFL for tasks assessed/performed Overall Cognitive Status: Within Functional Limits for tasks assessed  General Comments  VSS on RA    Exercises     Shoulder Instructions      Home Living Family/patient expects to be discharged to:: Private residence Living Arrangements: Children (daughter & daughter's family) Available Help at Discharge: Family;Available PRN/intermittently Type of Home: House Home  Access: Level entry     Home Layout: One level     Bathroom Shower/Tub: Teacher, early years/pre: Standard     Home Equipment: Cane - single point          Prior Functioning/Environment Prior Level of Function : Independent/Modified Independent;Driving;Working/employed             Mobility Comments: no AD use ADLs Comments: 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[employer], ind with ADLs/IADLs        OT Problem List: Decreased activity tolerance      OT Treatment/Interventions:      OT Goals(Current goals can be found in the care plan section) Acute Rehab OT Goals Patient Stated Goal: none stated OT Goal Formulation: With patient Time For Goal Achievement: 10/08/22 Potential to Achieve Goals: Good  OT Frequency:      Co-evaluation              AM-PAC OT "6 Clicks" Daily Activity     Outcome Measure Help from another person eating meals?: None Help from another person taking care of personal grooming?: None Help from another person toileting, which includes using toliet, bedpan, or urinal?: None Help from another person bathing (including washing, rinsing, drying)?: A Little Help from another person to put on and taking off regular upper body clothing?: None Help from another person to put on and taking off regular lower body clothing?: None 6 Click Score: 23   End of Session Nurse Communication: Mobility status  Activity Tolerance: Patient tolerated treatment well Patient left: in bed;with call bell/phone within reach;with bed alarm set  OT Visit Diagnosis: Muscle weakness (generalized) (M62.81)                Time: [id number] OT Time Calculation (min): 20 min Charges:  OT General Charges $OT Visit: 1 Visit OT Evaluation $OT Eval Low Complexity: 1 Low  Renaye Rakers, OTD, OTR/L SecureChat Preferred Acute Rehab [phone]  Armour Villanueva K Koonce 09/24/2022, 12:17 PM

## 2022-10-01 DIAGNOSIS — D649 Anemia, unspecified: Secondary | ICD-10-CM | POA: Diagnosis not present

## 2022-10-08 DIAGNOSIS — Z86711 Personal history of pulmonary embolism: Secondary | ICD-10-CM | POA: Diagnosis not present

## 2022-10-08 DIAGNOSIS — I480 Paroxysmal atrial fibrillation: Secondary | ICD-10-CM | POA: Diagnosis not present

## 2022-10-08 DIAGNOSIS — N1832 Chronic kidney disease, stage 3b: Secondary | ICD-10-CM | POA: Diagnosis not present

## 2022-10-08 DIAGNOSIS — I129 Hypertensive chronic kidney disease with stage 1 through stage 4 chronic kidney disease, or unspecified chronic kidney disease: Secondary | ICD-10-CM | POA: Diagnosis not present

## 2022-10-08 DIAGNOSIS — I5032 Chronic diastolic (congestive) heart failure: Secondary | ICD-10-CM | POA: Diagnosis not present

## 2022-10-08 DIAGNOSIS — E7800 Pure hypercholesterolemia, unspecified: Secondary | ICD-10-CM | POA: Diagnosis not present

## 2022-10-09 DIAGNOSIS — I5032 Chronic diastolic (congestive) heart failure: Secondary | ICD-10-CM | POA: Diagnosis not present

## 2022-10-09 DIAGNOSIS — R911 Solitary pulmonary nodule: Secondary | ICD-10-CM | POA: Diagnosis not present

## 2022-10-09 DIAGNOSIS — J8283 Eosinophilic asthma: Secondary | ICD-10-CM | POA: Diagnosis not present

## 2022-10-09 DIAGNOSIS — I2699 Other pulmonary embolism without acute cor pulmonale: Secondary | ICD-10-CM | POA: Diagnosis not present

## 2022-10-09 DIAGNOSIS — J4540 Moderate persistent asthma, uncomplicated: Secondary | ICD-10-CM | POA: Diagnosis not present

## 2022-10-09 DIAGNOSIS — Z79899 Other long term (current) drug therapy: Secondary | ICD-10-CM | POA: Diagnosis not present

## 2023-03-06 DIAGNOSIS — H524 Presbyopia: Secondary | ICD-10-CM | POA: Diagnosis not present

## 2023-03-21 DIAGNOSIS — N1832 Chronic kidney disease, stage 3b: Secondary | ICD-10-CM | POA: Diagnosis not present

## 2023-04-02 DIAGNOSIS — I129 Hypertensive chronic kidney disease with stage 1 through stage 4 chronic kidney disease, or unspecified chronic kidney disease: Secondary | ICD-10-CM | POA: Diagnosis not present

## 2023-04-02 DIAGNOSIS — N1832 Chronic kidney disease, stage 3b: Secondary | ICD-10-CM | POA: Diagnosis not present

## 2023-04-11 DIAGNOSIS — I2699 Other pulmonary embolism without acute cor pulmonale: Secondary | ICD-10-CM | POA: Diagnosis not present

## 2023-04-11 DIAGNOSIS — R911 Solitary pulmonary nodule: Secondary | ICD-10-CM | POA: Diagnosis not present

## 2023-04-11 DIAGNOSIS — I5032 Chronic diastolic (congestive) heart failure: Secondary | ICD-10-CM | POA: Diagnosis not present

## 2023-04-11 DIAGNOSIS — Z79899 Other long term (current) drug therapy: Secondary | ICD-10-CM | POA: Diagnosis not present

## 2023-04-11 DIAGNOSIS — J4540 Moderate persistent asthma, uncomplicated: Secondary | ICD-10-CM | POA: Diagnosis not present

## 2023-08-13 DIAGNOSIS — I10 Essential (primary) hypertension: Secondary | ICD-10-CM | POA: Diagnosis not present

## 2023-08-13 DIAGNOSIS — R7303 Prediabetes: Secondary | ICD-10-CM | POA: Diagnosis not present

## 2023-08-13 DIAGNOSIS — I700 Atherosclerosis of aorta: Secondary | ICD-10-CM | POA: Diagnosis not present

## 2023-08-13 DIAGNOSIS — R413 Other amnesia: Secondary | ICD-10-CM | POA: Diagnosis not present

## 2023-08-13 DIAGNOSIS — I480 Paroxysmal atrial fibrillation: Secondary | ICD-10-CM | POA: Diagnosis not present

## 2023-08-13 DIAGNOSIS — E663 Overweight: Secondary | ICD-10-CM | POA: Diagnosis not present

## 2023-08-13 DIAGNOSIS — E7800 Pure hypercholesterolemia, unspecified: Secondary | ICD-10-CM | POA: Diagnosis not present

## 2023-08-13 DIAGNOSIS — M8588 Other specified disorders of bone density and structure, other site: Secondary | ICD-10-CM | POA: Diagnosis not present

## 2023-08-13 DIAGNOSIS — J069 Acute upper respiratory infection, unspecified: Secondary | ICD-10-CM | POA: Diagnosis not present

## 2023-08-13 DIAGNOSIS — Z03818 Encounter for observation for suspected exposure to other biological agents ruled out: Secondary | ICD-10-CM | POA: Diagnosis not present

## 2023-08-13 DIAGNOSIS — N1831 Chronic kidney disease, stage 3a: Secondary | ICD-10-CM | POA: Diagnosis not present

## 2023-08-23 ENCOUNTER — Encounter: Payer: Self-pay | Admitting: Physician Assistant

## 2023-09-17 NOTE — Progress Notes (Signed)
 Assessment/Plan:     Helen Stanton is a very pleasant 78 y.o. year old RH female with a history of hypertension, hyperlipidemia, asthma, CHF, Afib on AC, COPD on nightly oxygen, history of PE, CKD3, prediabetes, microcytic anemia, seen today for evaluation of memory loss. MoCA today is 20/30.  She is able to participate on her ADLs and continues to drive.  The etiology of her memory loss may be multifactorial including situational stress, although vascular etiologies in the differential.  Workup is in progress.  Memory Impairment  MRI brain without contrast to assess for underlying structural abnormality and assess vascular load  Neurocognitive testing to further evaluate cognitive concerns and determine other underlying cause of memory changes, including potential contribution from sleep, anxiety, attention, or depression among others  Discussed with her daughter that, pending on the results of the MRI, we may initiate antidementia medication in an effort to slow down memory decline. Will obtain OP labs recently drawn by her PCP.  Recommend good control of cardiovascular risk factors.   Continue to control mood as per PCP [place] up in 3 months  Subjective:    The patient is accompanied by her daughter who lives in [place]  who supplements the history.    How long did patient have memory difficulties?  For about 3 years,worse over the last year "-daughter agrees.  Patient reports some difficulty remembering new information, recent conversations, names. LTM is affected as well according to her.  repeats oneself?  Endorsed by her daughter. Disoriented when walking into a room?  Patient denies.    Leaving objects in unusual places?  denies   Wandering behavior? denies   Any personality changes, or depression, anxiety?  She has lost 3 siblings in 2 years, and her husband died in 2024-10-27which brought significant situational depression.  In addition her daughter reports that "She came here in  2007 after her daughter had gotten divorced, the kids got older and she took more that she should have.  The children are currently 13, 10, 8 and 7". Daughter believes that this may have aggravated the symptoms, to the point that she could not return home and deal with it, she was spending most of the time outside.  "She is a home buddy and could not be at home".  At church she was lacking participation, "fell  off, she was no longer going to [place]".  Her daughter reports that recently, she met "this guy in [place], who is 30 years her junior, who may be more interested in her finances and wanted to move with her "for which the family had to intervene. Hallucinations or paranoia? denies   Seizures? denies    Any sleep changes?   Does not sleep well for the last 6 months.  Denies vivid dreams, REM behavior or sleepwalking   Sleep apnea? Denies.   Any hygiene concerns?  Denies.   Independent of bathing and dressing? Endorsed  Does the patient need help with medications? Patient is in charge   Who is in charge of the finances? Daughter and patient  going it together after patient purchasing solar panels for her home    Any changes in appetite?   Denies.     Patient have trouble swallowing?  Denies.   Does the patient cook? Yes, denies kitchen accidents Any headaches?  Denies.   Chronic pain? Denies.   Ambulates with difficulty? Denies    Recent falls or head injuries? Denies.     Vision  changes?  "My vision changed, need new glasses".  Has a history of glaucoma Any strokelike symptoms? Denies.   Any tremors? Occasionally. Sometimes she has difficulty picking up heavy objects and signing her name.    Any anosmia? Endorsed as well as decreased taste for at least 18 years "because I worked with bleach, had polyp surgery".   Any incontinence of urine? Denies.   Any bowel dysfunction? Denies.      Patient lives alone, until 1 month ago she was living with her other daughter and part time with her  grandchildren "which may have been part of the problem"-daughter says   History of heavy alcohol intake? Denies.   History of heavy tobacco use? Denies.   Family history of dementia? Mother had vascular dementia. One sister with vascular dementia  Does patient drive? Yes, denies any issues Retired [employer], 2023.  Allergies  Allergen Reactions   Fish Allergy Shortness Of Breath   Glucosamine Shortness Of Breath   Milk-Related Compounds Shortness Of Breath   Shellfish-Derived Products Shortness Of Breath and Other (See Comments)    Asthma    Diltiazem Hcl Rash    Current Outpatient Medications  Medication Instructions   albuterol (PROVENTIL HFA;VENTOLIN HFA) 108 (90 BASE) MCG/ACT inhaler 2 puffs, Every 6 hours PRN   amiodarone (PACERONE) 200 mg, Oral, Daily   apixaban (ELIQUIS) 5 mg, Oral, 2 times daily   atorvastatin (LIPITOR) 40 MG tablet 1 tablet, Daily   fluticasone (FLONASE) 50 MCG/ACT nasal spray 1 spray, 2 times daily PRN   fluticasone-salmeterol (ADVAIR HFA) 230-21 MCG/ACT inhaler 2 puffs, Inhalation, 2 times daily   Metoprolol Tartrate 37.5 mg, Oral, 2 times daily   montelukast (SINGULAIR) 10 MG tablet 1 tablet, Daily at bedtime   spironolactone (ALDACTONE) 50 mg, Daily   torsemide (DEMADEX) 20 mg, Daily PRN   valsartan-hydrochlorothiazide (DIOVAN-HCT) 320-12.5 MG tablet 1 tablet, Daily     VITALS:   Vitals:   09/18/23 0744  BP: 138/71  Pulse: 71  Resp: 18  SpO2: 98%  Weight: 164 lb (74.4 kg)  Height: 5\' 1"  (1.549 m)      PHYSICAL EXAM   HEENT:  Normocephalic, atraumatic.  The superficial temporal arteries are without ropiness or tenderness. Cardiovascular: Irregular regular rate and rhythm. Lungs: Clear to auscultation bilaterally. Neck: There are no carotid bruits noted bilaterally.  NEUROLOGICAL:    09/18/2023    8:00 AM  Montreal Cognitive Assessment   Visuospatial/ Executive (0/5) 3  Naming (0/3) 3  Attention: Read list of digits (0/2) 2   Attention: Read list of letters (0/1) 1  Attention: Serial 7 subtraction starting at 100 (0/3) 0  Language: Repeat phrase (0/2) 1  Language : Fluency (0/1) 1  Abstraction (0/2) 0  Delayed Recall (0/5) 4  Orientation (0/6) 5  Total 20  Adjusted Score (based on education) 20        No data to display           Orientation:  Alert and oriented to person,  place and time no aphasia or dysarthria. Fund of knowledge is appropriate. Recent and remote memory impaired.  Attention and concentration are reduced.  Able to name objects and repeat phrases.. Delayed recall 4/5 Cranial nerves: There is good facial symmetry. Extraocular muscles are intact and visual fields are full to confrontational testing. Speech is fluent and clear. No tongue deviation. Hearing is intact to conversational tone. Abnormal movement: Minimal intention tremors LE, no resting tremor, no asterixis no fasciculations  tone:  Tone is good throughout.  No cogwheeling Sensation: Sensation is intact to light touch.  Vibration is intact at the bilateral big toe.  Coordination: The patient has no difficulty with RAM's or FNF bilaterally. Normal finger to nose  Motor: Strength is 5/5 in the bilateral upper and lower extremities. There is no pronator drift. There are no fasciculations noted. DTR's: Deep tendon reflexes are 2/4 bilaterally. Gait and Station: The patient is able to ambulate without difficulty. Gait is cautious and narrow. Stride length is normal       Thank you for allowing [place] the opportunity to participate in the care of this nice patient. Please do not hesitate to contact us for any questions or concerns.   Total time spent on today's visit was 62 minutes dedicated to this patient today, preparing to see patient, examining the patient, ordering tests and/or medications and counseling the patient, documenting clinical information in the EHR or other health record, independently interpreting results and  communicating results to the patient/family, discussing treatment and goals, answering patient's questions and coordinating care.  Cc:  Renford Dills, MD  Marlowe Kays 09/18/2023 9:31 AM

## 2023-09-18 ENCOUNTER — Ambulatory Visit: Payer: Medicare PPO

## 2023-09-18 ENCOUNTER — Encounter: Payer: Self-pay | Admitting: Physician Assistant

## 2023-09-18 ENCOUNTER — Ambulatory Visit: Payer: Medicare HMO | Admitting: Physician Assistant

## 2023-09-18 VITALS — BP 138/71 | HR 71 | Resp 18 | Ht 61.0 in | Wt 164.0 lb

## 2023-09-18 DIAGNOSIS — R413 Other amnesia: Secondary | ICD-10-CM

## 2023-09-18 NOTE — Patient Instructions (Addendum)
 It was a pleasure to see you today at our office.   Recommendations:  Neurocognitive evaluation at our office  MRI of the brain, the radiology office will call you to arrange you appointment [phone] Follow up in 3 months Recommend visiting the website : " [facility]ementia [facility]" to better understand some behaviors related to memory loss.    For psychiatric meds, mood meds: Please have your primary care physician manage these medications.  If you have any severe symptoms of a stroke, or other severe issues such as confusion,severe chills or fever, etc call 911 or go to the ER as you may need to be evaluated further   For guidance regarding [facility] and if placement were needed at the facility, contact Social Worker tel: [phone]  For assessment of decision of mental capacity and competency:  Call Dr. Erick Blinks, geriatric psychiatrist at [phone]  Counseling regarding caregiver distress, including caregiver depression, anxiety and issues regarding community resources, adult day care programs, adult living facilities, or memory care questions:  please contact your  Primary Doctor's Social Worker   Whom to call: Memory  decline, memory medications: Call our office [phone]    [email or web address]   RECOMMENDATIONS FOR ALL PATIENTS WITH MEMORY PROBLEMS: 1. Continue to exercise (Recommend 30 minutes of walking everyday, or 3 hours every week) 2. Increase social interactions - continue going to [place] and enjoy social gatherings with friends and family 3. Eat healthy, avoid fried foods and eat more fruits and vegetables 4. Maintain adequate blood pressure, blood sugar, and blood cholesterol level. Reducing the risk of stroke and cardiovascular disease also helps promoting better memory. 5. Avoid stressful situations. Live a simple life and avoid aggravations. Organize your time and prepare for the  next day in anticipation. 6. Sleep well, avoid any interruptions of sleep and avoid any distractions in the bedroom that may interfere with adequate sleep quality 7. Avoid sugar, avoid sweets as there is a strong link between excessive sugar intake, diabetes, and cognitive impairment We discussed the Mediterranean diet, which has been shown to help patients reduce the risk of progressive memory disorders and reduces cardiovascular risk. This includes eating fish, eat fruits and green leafy vegetables, nuts like almonds and hazelnuts, walnuts, and also use olive oil. Avoid fast foods and fried foods as much as possible. Avoid sweets and sugar as sugar use has been linked to worsening of memory function.  There is always a concern of gradual progression of memory problems. If this is the case, then we may need to adjust level of care according to patient needs. Support, both to the patient and caregiver, should then be put into place.      You have been referred for a neuropsychological evaluation (i.e., evaluation of memory and thinking abilities). Please bring someone with you to this appointment if possible, as it is helpful for the doctor to hear from both you and another adult who knows you well. Please bring eyeglasses and hearing aids if you wear them.    The evaluation will take approximately 3 hours and has two parts:   The first part is a clinical interview with the neuropsychologist (Dr. Milbert Coulter or Dr. Robbie Lis). During the interview, the neuropsychologist will speak with you and the individual you brought to the appointment.    The second part of the evaluation is testing with the doctor's technician Annabelle Harman or Selena Batten). During the testing, the technician will ask you to remember different types of material, solve problems,  and answer some questionnaires. Your family member will not be present for this portion of the evaluation.   Please note: We must reserve several hours of the neuropsychologist's  time and the psychometrician's time for your evaluation appointment. As such, there is a No-Show fee of $100. If you are unable to attend any of your appointments, please contact our office as soon as possible to reschedule.      DRIVING: Regarding driving, in patients with progressive memory problems, driving will be impaired. We advise to have someone else do the driving if trouble finding directions or if minor accidents are reported. Independent driving assessment is available to determine safety of driving.   If you are interested in the driving assessment, you can contact the following:  [facility] [employer] in [place] [phone]  [facility] [phone]  [facility] [phone]  [facility] [phone] or [phone]   FALL PRECAUTIONS: Be cautious when walking. Scan the area for obstacles that may increase the risk of trips and falls. When getting up in the mornings, sit up at the edge of the bed for a few minutes before getting out of bed. Consider elevating the bed at the head end to avoid drop of blood pressure when getting up. Walk always in a well-lit room (use night lights in the walls). Avoid area rugs or power cords from appliances in the middle of the walkways. Use a walker or a cane if necessary and consider physical therapy for balance exercise. Get your eyesight checked regularly.  FINANCIAL OVERSIGHT: Supervision, especially oversight when making financial decisions or transactions is also recommended.  HOME SAFETY: Consider the safety of the kitchen when operating appliances like stoves, microwave oven, and blender. Consider having supervision and share cooking responsibilities until no longer able to participate in those. Accidents with firearms and other hazards in the house should be identified and addressed as well.   ABILITY TO BE LEFT ALONE: If patient is unable to contact 911 operator, consider using LifeLine, or  when the need is there, arrange for someone to stay with patients. Smoking is a fire hazard, consider supervision or cessation. Risk of wandering should be assessed by caregiver and if detected at any point, supervision and safe proof recommendations should be instituted.  MEDICATION SUPERVISION: Inability to self-administer medication needs to be constantly addressed. Implement a mechanism to ensure safe administration of the medications.      Mediterranean Diet A Mediterranean diet refers to food and lifestyle choices that are based on the traditions of countries located on the [employer]. This way of eating has been shown to help prevent certain conditions and improve outcomes for people who have chronic diseases, like kidney disease and heart disease. What are tips for following this plan? Lifestyle  Cook and eat meals together with your family, when possible. Drink enough fluid to keep your urine clear or pale yellow. Be physically active every day. This includes: Aerobic exercise like running or swimming. Leisure activities like gardening, walking, or housework. Get 7-8 hours of sleep each night. If recommended by your health care provider, drink red wine in moderation. This means 1 glass a day for nonpregnant women and 2 glasses a day for men. A glass of wine equals 5 oz (150 mL). Reading food labels  Check the serving size of packaged foods. For foods such as rice and pasta, the serving size refers to the amount of cooked product, not dry. Check the total fat in packaged foods. Avoid foods  that have saturated fat or trans fats. Check the ingredients list for added sugars, such as corn syrup. Shopping  At the grocery store, buy most of your food from the areas near the walls of the store. This includes: Fresh fruits and vegetables (produce). Grains, beans, nuts, and seeds. Some of these may be available in unpackaged forms or large amounts (in bulk). Fresh seafood. Poultry  and eggs. Low-fat dairy products. Buy whole ingredients instead of prepackaged foods. Buy fresh fruits and vegetables in-season from local farmers markets. Buy frozen fruits and vegetables in resealable bags. If you do not have access to quality fresh seafood, buy precooked frozen shrimp or canned fish, such as tuna, salmon, or sardines. Buy small amounts of raw or cooked vegetables, salads, or olives from the deli or salad bar at your store. Stock your pantry so you always have certain foods on hand, such as olive oil, canned tuna, canned tomatoes, rice, pasta, and beans. Cooking  Cook foods with extra-virgin olive oil instead of using butter or other vegetable oils. Have meat as a side dish, and have vegetables or grains as your main dish. This means having meat in small portions or adding small amounts of meat to foods like pasta or stew. Use beans or vegetables instead of meat in common dishes like chili or lasagna. Experiment with different cooking methods. Try roasting or broiling vegetables instead of steaming or sauteing them. Add frozen vegetables to soups, stews, pasta, or rice. Add nuts or seeds for added healthy fat at each meal. You can add these to yogurt, salads, or vegetable dishes. Marinate fish or vegetables using olive oil, lemon juice, garlic, and fresh herbs. Meal planning  Plan to eat 1 vegetarian meal one day each week. Try to work up to 2 vegetarian meals, if possible. Eat seafood 2 or more times a week. Have healthy snacks readily available, such as: Vegetable sticks with hummus. Greek yogurt. Fruit and nut trail mix. Eat balanced meals throughout the week. This includes: Fruit: 2-3 servings a day Vegetables: 4-5 servings a day Low-fat dairy: 2 servings a day Fish, poultry, or lean meat: 1 serving a day Beans and legumes: 2 or more servings a week Nuts and seeds: 1-2 servings a day Whole grains: 6-8 servings a day Extra-virgin olive oil: 3-4 servings a  day Limit red meat and sweets to only a few servings a month What are my food choices? Mediterranean diet Recommended Grains: Whole-grain pasta. Brown rice. Bulgar wheat. Polenta. Couscous. Whole-wheat bread. Orpah Cobb. Vegetables: Artichokes. Beets. Broccoli. Cabbage. Carrots. Eggplant. Green beans. Chard. Kale. Spinach. Onions. Leeks. Peas. Squash. Tomatoes. Peppers. Radishes. Fruits: Apples. Apricots. Avocado. Berries. Bananas. Cherries. Dates. Figs. Grapes. Lemons. Melon. Oranges. Peaches. Plums. Pomegranate. Meats and other protein foods: Beans. Almonds. Sunflower seeds. Pine nuts. Peanuts. Cod. Salmon. Scallops. Shrimp. Tuna. Tilapia. Clams. Oysters. Eggs. Dairy: Low-fat milk. Cheese. Greek yogurt. Beverages: Water. Red wine. Herbal tea. Fats and oils: Extra virgin olive oil. Avocado oil. Grape seed oil. Sweets and desserts: [place] yogurt with honey. Baked apples. Poached pears. Trail mix. Seasoning and other foods: Basil. Cilantro. Coriander. Cumin. Mint. Parsley. Sage. Rosemary. Tarragon. Garlic. Oregano. Thyme. Pepper. Balsalmic vinegar. Tahini. Hummus. Tomato sauce. Olives. Mushrooms. Limit these Grains: Prepackaged pasta or rice dishes. Prepackaged cereal with added sugar. Vegetables: Deep fried potatoes (french fries). Fruits: Fruit canned in syrup. Meats and other protein foods: Beef. Pork. Lamb. Poultry with skin. Hot dogs. Tomasa Blase. Dairy: Ice cream. Sour cream. Whole milk. Beverages: Juice. Sugar-sweetened soft drinks.  Beer. Liquor and spirits. Fats and oils: Butter. Canola oil. Vegetable oil. Beef fat (tallow). Lard. Sweets and desserts: Cookies. Cakes. Pies. Candy. Seasoning and other foods: Mayonnaise. Premade sauces and marinades. The items listed may not be a complete list. Talk with your dietitian about what dietary choices are right for you. Summary The Mediterranean diet includes both food and lifestyle choices. Eat a variety of fresh fruits and vegetables, beans,  nuts, seeds, and whole grains. Limit the amount of red meat and sweets that you eat. Talk with your health care provider about whether it is safe for you to drink red wine in moderation. This means 1 glass a day for nonpregnant women and 2 glasses a day for men. A glass of wine equals 5 oz (150 mL). This information is not intended to replace advice given to you by your health care provider. Make sure you discuss any questions you have with your health care provider. Document Released: 02/23/2016 Document Revised: 03/27/2016 Document Reviewed: 02/23/2016 Elsevier Interactive Patient Education  2017 [employer].

## 2023-09-26 ENCOUNTER — Ambulatory Visit: Admitting: Psychology

## 2023-09-26 ENCOUNTER — Ambulatory Visit: Payer: Self-pay | Admitting: Psychology

## 2023-09-26 DIAGNOSIS — G3184 Mild cognitive impairment, so stated: Secondary | ICD-10-CM

## 2023-09-26 DIAGNOSIS — R4189 Other symptoms and signs involving cognitive functions and awareness: Secondary | ICD-10-CM

## 2023-09-26 NOTE — Progress Notes (Addendum)
 NEUROPSYCHOLOGICAL EVALUATION [place]. [facility]  [place] Department of Neurology  Date of Evaluation: 09/26/2023  REASON FOR REFERRAL   Arnecia Ector is an 78 year old, right-handed, Black female with 14 years of formal education. She was referred for neuropsychological evaluation by Marlowe Kays, PA-C, to assess current neurocognitive functioning, document potential cognitive deficits, and assist with treatment planning. This is her first neuropsychological evaluation.  SUMMARY OF RESULTS   Premorbid cognitive abilities are estimated to be in the average range based on word reading and sociodemographic factors. On assessment today, she demonstrated variable working memory, Museum/gallery exhibitions officer, executive functioning, and memory with otherwise normal test performance. Working Civil Service fast streamer for reciting digits in forwards and backwards order was normal, but sequencing the digits was relatively low. Scores were below expectations when copying simple figures or judging line orientations but intact when constructing block to match target designs. She did well on tasks of executive functioning, especially generative fluencies, but she did commit numerous errors on tasks of response inhibition, inhibition/switching, and divided attention/set shifting. Regarding verbal memory, she demonstrated low encoding of a word list, which likely impacted her delayed recall, but her recognition of the information was within normal limits. Comparatively, she performed within expectations when encoding, recalling, and recognizing short stories. With regard to visual memory, her encoding and recall of shapes was intact, but her recognition was relatively reduced due to three false positive errors. Remaining measures of processing speed, language, and fine motor dexterity were within expectations. On self-report questionnaires, she endorsed mild levels of anxiety. She did not endorse clinically-elevated levels of  depression.  DIAGNOSTIC IMPRESSION   Results of the current evaluation indicate a primarily frontal-subcortical pattern of deficits (e.g., elevated errors on tasks of executive functioning, greater difficulty on less structured memory task, elevated false positive errors on the visual memory task despite normal recall), though deficits on select tasks of visuospatial function were also present. Other areas of cognitive functioning remained intact. 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 Patient has several vascular risk factors, which is the most likely etiology explaining the deficits identified today. However, upcoming MRI will help clarify if cerebrovascular disease is present and rule in/out other intracranial abnormalities. Other possible factors contributing to the overall clinical picture include mood (i.e., mild anxiety) and grief (i.e., recent passing of several siblings). Serial assessment will be most beneficial in clarifying the underlying etiology, monitoring her course, and adapting the treatment plan over time.  ICD-10 Codes: G31.84 Mild cognitive impairment  RECOMMENDATIONS   A repeat neuropsychological evaluation in 12-18 months (or sooner if functional decline is noted) is recommended.  Patient is encouraged to continue managing vascular risk factors through heart healthy diet, physician-approved physical activity, and medication adherence.  While she raised no concerns, she should continue to monitor her driving. Neurocognitive testing is not a strong predictor of an individual's safety operating a motor vehicle, however, weaknesses in executive and visuospatial functioning may be of concern. If problems arise, she should consider undergoing a driving evaluation.  To address executive inefficiencies identified on assessment today, she may wish to consider some of the  following techniques:   -Avoid external distractions when needing to concentrate.   -Limit exposure to fast paced environments with multiple sensory demands.   -Write down complicated information and using checklists.   -Attempt and complete one task at a time (i.e., no multi-tasking).   -Verbalize aloud each step of a  task to maintain focus.   -Take frequent breaks during the completion of steps/tasks to avoid fatigue.   -Reduce the amount of information considered at one time.   -Schedule more difficult activities for a time of day where she is usually most alert.  Memory can be improved using internal strategies such as rehearsal, repetition, chunking, mnemonics, association, and imagery. External strategies such as written notes in a consistently used memory journal, visual and nonverbal auditory cues such as a calendar on the refrigerator or appointments with alarm, such as on a cell phone, can also help maximize recall.    When learning new information, she would benefit from information being broken up into small, manageable pieces. She may also find it helpful to articulate the material in her own words and in a context to promote encoding at the onset of a new task. This material may need to be repeated multiple times to promote encoding.   DISPOSITION   Patient will follow up with the referring provider, Ms. Wertman. She should return for repeat neuropsychological testing in 12-18 months to monitor her course and assist with diagnosis and treatment planning. She will be provided verbal feedback in approximately one week regarding the findings and impression during this visit.  The remainder of the report includes the details of the patient's background and a table of results from the current evaluation, which support the summary and recommendations described above.  BACKGROUND   History of Presenting Illness: The following information was obtained following a review of medical records  and an interview with the patient. Patient recently established care with neurology on 09/18/2023 due to memory concerns. MoCA = 20/30. Per neurology, etiological considerations for memory difficulties include vascular risk factors and stress. She was referred for neuropsychological evaluation accordingly.  Cognitive Functioning: During today's appointment, the patient reported that it was her daughter and son-in-law's concerns that led her to seek an evaluation of her memory. Patient acknowledge mild cognitive changes since she was hospitalized and diagnosed with atrial fibrillation in August 2022. She feels that the course has remained relatively stable. She endorsed mild difficulties with short-term memory, including sometimes forgetting details of conversations and misplacing items. She noted that she sometimes walks into a room and forgets why. She has also appreciated occasional word finding difficulties and slowed processing speed. She denied concerns with attention, navigation, and executive functioning (e.g., planning, organizing).  Physical Functioning: Patient denied difficulties with sleep initiation and maintenance most nights. Now that she has more free time during the day, she occasionally takes naps, and on those days, she tends to wake up in the middle of the night. Otherwise, she sleeps well. She does not act out her dreams. Appetite is stable. 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 Vision has recently become increasingly blurry, so she plans to schedule an appointment to have her vision evaluated soon. Hearing is stable. She denied falls and difficulties with balance. She has noticed a slight tremor in her hands with action and wonders if it is related to her medications.  Emotional Functioning: Patient reported her current mood as "smooth" and stated that she feels  "better since being on [her] own" (referring to the family members who had been living in her house and are now in the process of moving out). She is currently grieving the loss of several siblings. Specifically, she noted that five of  her siblings have passed--one in September, one a year prior, and the other three years before that. She denied suicidal ideation. She enjoys gardening, painting, Freight forwarder, sewing, reading, cooking, and participating in church activities.  Imaging: MRI has been ordered by neurology but has not yet been completed.  Other Relevant Medical History: Remarkable for atrial fibrillation, supraventricular tachycardia, left bundle branch block, hyperlipidemia, hypertension, history of pulmonary embolism, asthma, and osteopenia of spine. External medical records also indicate chronic kidney disease stage III, though the patient was unsure about this diagnosis. However, she did note seeing a nephrologist in the past. No history of stroke, CNS infection, head injury, or seizure was reported.  Current Medications: Per record, albuterol prn, amiodarone prn, apixaban prn, atorvastatin prn, fluticasone prn, metoprolol, montelukast, spironolactone, torsemide prn, and valsartan-hydrochlorothiazide.  Functional Status: Patient independently performs all ADLs and IADLs, including driving and medication management, without difficulty. Her daughter and son-in-law have recently become more involved with her finances, but she explained that this is due to "putting things in order" as opposed to errors.  Family Neurological History: Remarkable for vascular dementia (mother, sister). She suspects that her father may have also had a cognitive disorder, but she is unsure because she did not grow up with him.  Psychiatric History: History of depression, anxiety, prior mental health treatment, suicidal ideation, hallucinations, and psychiatric hospitalizations was not reported.  Substance Use  History: Patient denied use of alcohol, nicotine, marijuana, and illicit substances.  Social and Developmental History: Patient was born in [place], [place]. History of perinatal complications and developmental delays was not reported. She has been widowed since 1999; medical records indicated October 2024, but the patient said this was not correct. She has three children, two of whom live in [place]. She visits [place] every six months to see her doctors and her family. She has been residing in [place] with her middle daughter and her daughter's family (e.g., son and four grandchildren). Her daughter recently moved out, and the rest of the grandchildren and great-grandchildren are expected to move out by June.  Educational and Occupational History: No history of childhood learning disability, special education services, or grade retention was reported. Patient obtained an associate degree in early childhood education. 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 She recently retired in 2023.  BEHAVIORAL OBSERVATIONS   Patient arrived on time and was unaccompanied. She ambulated independently and without gait disturbance. She was alert and fully oriented. She was appropriately groomed and dressed for the setting. No significant sensory or motor abnormalities were observed. Vision (corrected) and hearing were adequate for testing purposes. Speech was of normal rate, prosody, and volume. No conversational word-finding difficulties, paraphasic errors, or dysarthria were observed. Comprehension was conversationally intact. Thought processes were linear, logical, and coherent. Thought content was organized and devoid of delusions. Insight appeared intact. Affect was even and congruent with mood. She was cooperative and put forth adequate effort during testing, including on most standalone and embedded measures of performance validity. Of the two standalone  measures that were given, one score was one point outside the normal range (likely due to nervousness at the beginning of the test which led to her taking extra time to slowly count the stimulus). Results are thought to accurately reflect her cognitive functioning at this time.  NEUROPSYCHOLOGICAL TESTING RESULTS   Tests Administered: Animal Naming Test; Beck Anxiety Inventory (BAI); Beck Depression Inventory II (BDI-II); Brief Visuospatial Memory  Test-Revised (BVMT-R) - Form 1; Controlled Oral Word Association Test (COWAT): FAS; Delis-Kaplan Air cabin crew System (D-KEFS) - Subtest(s): Color-Word Interference Test; Grooved Pegboard Test; [employer] Learning Test Revised (HVLT-R) - From 1; Judgment of Line Orientation (JLO) - Form H; Neuropsychological Assessment Battery (NAB) - Subtest(s): Naming Form 1; Standalone performance validity tests (PVTs); Test of Premorbid Functioning (TOPF); Trail Making Test (TMT); Wechsler Adult Intelligence Scale Fourth Edition (WAIS-IV) - Subtest(s): Clinical cytogeneticist, Matrix Reasoning, Similarities, Digit Span, Arithmetic, Symbol Search, Coding; and Wechsler Memory Scale Fourth Edition (WMS-IV) - Subtest(s): Logical Memory (LM),  Test results are provided in the table below. Whenever possible, the patient's scores were compared against age-, sex-, and education-corrected normative samples. Interpretive descriptions are based on the AACN consensus conference statement on uniform labeling (Guilmette et al., 2020).  PREMORBID FUNCTIONING RAW  RANGE  TOPF 29 StdS=93 Average  ATTENTION & WORKING MEMORY RAW  RANGE  WAIS-IV Digit Span -- ss=7 Low Average  Forward -- ss=11 Average  Backward -- ss=7 Low Average  Sequencing -- ss=5 Below Average  PROCESSING SPEED RAW  RANGE  Trails A 71''3e T=37 Low Average  WAIS-IV Symbol Search -- ss=6 Low Average  WAIS-IV Coding  -- ss=9 Average  DKEFS CWIT Color Naming 32''0e ss=12 High Average  DKEFS CWIT Word Reading 20''0e ss=13  High Average  EXECUTIVE FUNCTION RAW  RANGE  Trails B 150''2e T=51 Average  WAIS-IV Matrix Reasoning -- ss=8 Average  WAIS-IV Similarities -- ss=12 High Average  COWAT Letter Fluency 17+12+20 T=67 Above Average  Animal Naming Test 19 T=66 Above Average  DKEFS CWIT Inhibition 102''12e ss=9 Average  DKEFS CWIT Inhibition/Switching 105''21e ss=9 Average  LANGUAGE RAW  RANGE  COWAT Letter Fluency 17+12+20 T=67 Above Average  Animal Naming Test 19 T=66 Above Average  NAB Naming Test 31/31 T=64 WNL  VISUOSPATIAL RAW  RANGE  WAIS-IV Block Design -- ss=10 Average  JLO C/S = 18/30 4%ile Below Average  BVMT-R Copy Trial 9/12 -- BNL  VERBAL LEARNING & MEMORY RAW  RANGE  HVLT Learning Trials (2+6+5)/36 T=31 Below Average  HVLT Delayed Recall 3/12 T=31 Below Average  HVLT Recognition Hits 11 -- --  HVLT Recognition False Positives 2 -- --  HVLT Discrimination Index 9 T=43 Average  WMS-IV LM-I  (4+9+11)/53 ss=9 Average  WMS-IV LM-II  (7+9)/39 ss=11 Average  WMS-IV LM Recognition  (5+12)/23 26-50%ile Average  VISUOSPATIAL LEARNING & MEMORY RAW  RANGE  BVMT-R Total Recall (3+6+8)/36 T=53 Average  BVMT-R Delayed Recall 5/12 T=47 Average  BVMT-R Percent Retained 62.5 T=44 Average  BVMT-R Recognition Hits 6 T=56 Average  BVMT-R Recognition False Alarms 3 T=11 Exceptionally Low  BVMT-R Recognition Discrimination Index 3 T=30 Below Average  FINE MOTOR DEXTERITY RAW  RANGE  Grooved Pegboard (Dominant Hand) 124''0d T=45 Average  Grooved Pegboard (Non-Dominant Hand) 175''0d T=40 Low Average  QUESTIONNAIRES RAW  RANGE  BDI 12 -- Minimal  BAI 12 -- Mild  *Note: ss = scaled score; StdS = standard score; T = t-score; C/S = corrected raw score; WNL = within normal limits; BNL= below normal limits; D/C = discontinued. Scores from skewed distributions are typically interpreted as WNL (>=16th %ile) or BNL (<16th %ile).   INFORMED CONSENT   Patient was provided with a verbal description of the nature and  purpose of the neuropsychological evaluation. Also reviewed were the foreseeable risks and/or discomforts and benefits of the procedure, limits of confidentiality, and mandatory reporting requirements of this provider. Patient was given the opportunity to have their questions  answered. Oral consent to participate was provided by the patient.   This report was prepared as part of a clinical evaluation and is not intended for forensic use.  SERVICE   This evaluation was conducted by Annice Pih, Psy.D. In addition to time spent directly with the patient, total professional time includes record review, integration of relevant medical history, test selection, interpretation of findings, and report preparation. A technician, Wallace Keller, B.S., provided testing and scoring assistance for 120 minutes.  Psychiatric Diagnostic Evaluation Services (Professional): 16109 x 1 Neuropsychological Testing Evaluation Services (Professional): 60454 x 1 Neuropsychological Testing Evaluation Services (Professional): 09811 x 1 Neuropsychological Test Administration and Scoring Radiographer, therapeutic): [phone] x 1 Neuropsychological Test Administration and Scoring (Technician): [phone] x 3  This report was generated using voice recognition software. While this document has been carefully reviewed, transcription errors may be present. I apologize in advance for any inconvenience. Please contact me if further clarification is needed.            Annice Pih, Psy.D.             Neuropsychologist Patient

## 2023-09-26 NOTE — Progress Notes (Signed)
   Psychometrician Note   Cognitive testing was administered to Arminda Resides by Wallace Keller, B.S. (psychometrist) under the supervision of Dr. Annice Pih, Psy.D., licensed psychologist on 09/26/2023. Ms. Greenley did not appear overtly distressed by the testing session per behavioral observation or responses across self-report questionnaires. Rest breaks were offered.    The battery of tests administered was selected by Dr. Annice Pih, Psy.D. with consideration to Ms. Ruda's current level of functioning, the nature of her symptoms, emotional and behavioral responses during interview, level of literacy, observed level of motivation/effort, and the nature of the referral question. This battery was communicated to the psychometrist. Communication between Dr. Annice Pih, Psy.D. and the psychometrist was ongoing throughout the evaluation and Dr. Annice Pih, Psy.D. was immediately accessible at all times. Dr. Annice Pih, Psy.D. provided supervision to the psychometrist on the date of this service to the extent necessary to assure the quality of all services provided.    Earlena Werst will return within approximately 1-2 weeks for an interactive feedback session with Dr. Robbie Lis at which time her test performances, clinical impressions, and treatment recommendations will be reviewed in detail. Ms. Bujak understands she can contact our office should she require our assistance before this time.  A total of 120 minutes of billable time were spent face-to-face with Ms. Hauger by the psychometrist. This includes both test administration and scoring time. Billing for these services is reflected in the clinical report generated by Dr. Annice Pih, Psy.D.  This note reflects time spent with the psychometrician and does not include test scores or any clinical interpretations made by Dr. Robbie Lis. The full report will follow in a separate note.

## 2023-10-07 ENCOUNTER — Encounter: Admitting: Psychology

## 2023-10-07 DIAGNOSIS — E7800 Pure hypercholesterolemia, unspecified: Secondary | ICD-10-CM | POA: Diagnosis not present

## 2023-10-08 DIAGNOSIS — I5032 Chronic diastolic (congestive) heart failure: Secondary | ICD-10-CM | POA: Diagnosis not present

## 2023-10-08 DIAGNOSIS — E7800 Pure hypercholesterolemia, unspecified: Secondary | ICD-10-CM | POA: Diagnosis not present

## 2023-10-08 DIAGNOSIS — I480 Paroxysmal atrial fibrillation: Secondary | ICD-10-CM | POA: Diagnosis not present

## 2023-11-07 ENCOUNTER — Ambulatory Visit: Admitting: Psychology

## 2023-11-07 DIAGNOSIS — G3184 Mild cognitive impairment, so stated: Secondary | ICD-10-CM

## 2023-11-07 NOTE — Progress Notes (Signed)
   NEUROPSYCHOLOGY FEEDBACK SESSION [place]. [facility]  [place] Department of Neurology  Date of Feedback Session: 11/07/2023  REASON FOR REFERRAL   Helen Stanton is an 78 year old, right-handed, Black female with 14 years of formal education. She was referred for neuropsychological evaluation by Tex Filbert, PA-C, to assess current neurocognitive functioning, document potential cognitive deficits, and assist with treatment planning. This is her first neuropsychological evaluation.  FEEDBACK   Patient completed a comprehensive neuropsychological evaluation on 09/26/2023. Please refer to that encounter for the full report and recommendations. Briefly, results indicated a primarily frontal-subcortical pattern of deficits, though deficits on select tasks of visuospatial function were also present. Other areas of cognitive functioning remained intact. 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 Patient has several vascular risk factors, which is the most likely etiology explaining the deficits identified today. However, upcoming MRI will help clarify if cerebrovascular disease is present and rule in/out other intracranial abnormalities. Other possible factors contributing to the overall clinical picture include mood (i.e., mild anxiety) and grief (i.e., recent passing of several siblings). Serial assessment will be most beneficial in clarifying the underlying etiology, monitoring her course, and adapting the treatment plan over time.   Today, the patient was unaccompanied. She was provided verbal feedback regarding the findings and impression during this visit, and her questions were answered. A copy of the report was provided at the conclusion of the visit.  DISPOSITION   Patient will follow up with the referring provider, Helen Stanton. She should return for repeat  neuropsychological testing in 12-18 months to monitor her course and assist with diagnosis and treatment planning.   SERVICE   This feedback session was conducted by Janice Meeter, Psy.D. One unit of 16109 was billed for Dr. Donavon Fudge' time spent in preparing, conducting, and documenting the current feedback session.  This report was generated using voice recognition software. While this document has been carefully reviewed, transcription errors may be present. I apologize in advance for any inconvenience. Please contact me if further clarification is needed.

## 2023-11-22 DIAGNOSIS — G3184 Mild cognitive impairment, so stated: Secondary | ICD-10-CM | POA: Diagnosis not present

## 2023-11-22 DIAGNOSIS — I4891 Unspecified atrial fibrillation: Secondary | ICD-10-CM | POA: Diagnosis not present

## 2023-11-22 DIAGNOSIS — Z23 Encounter for immunization: Secondary | ICD-10-CM | POA: Diagnosis not present

## 2023-11-22 DIAGNOSIS — M8588 Other specified disorders of bone density and structure, other site: Secondary | ICD-10-CM | POA: Diagnosis not present

## 2023-11-22 DIAGNOSIS — I700 Atherosclerosis of aorta: Secondary | ICD-10-CM | POA: Diagnosis not present

## 2023-11-22 DIAGNOSIS — I10 Essential (primary) hypertension: Secondary | ICD-10-CM | POA: Diagnosis not present

## 2023-11-22 DIAGNOSIS — J45909 Unspecified asthma, uncomplicated: Secondary | ICD-10-CM | POA: Diagnosis not present

## 2023-11-22 DIAGNOSIS — Z0000 Encounter for general adult medical examination without abnormal findings: Secondary | ICD-10-CM | POA: Diagnosis not present

## 2023-11-22 DIAGNOSIS — I471 Supraventricular tachycardia, unspecified: Secondary | ICD-10-CM | POA: Diagnosis not present

## 2023-11-22 DIAGNOSIS — E7800 Pure hypercholesterolemia, unspecified: Secondary | ICD-10-CM | POA: Diagnosis not present

## 2023-11-22 DIAGNOSIS — Z1331 Encounter for screening for depression: Secondary | ICD-10-CM | POA: Diagnosis not present

## 2023-11-22 DIAGNOSIS — N1831 Chronic kidney disease, stage 3a: Secondary | ICD-10-CM | POA: Diagnosis not present

## 2023-11-29 DIAGNOSIS — N1831 Chronic kidney disease, stage 3a: Secondary | ICD-10-CM | POA: Diagnosis not present

## 2023-11-29 DIAGNOSIS — I10 Essential (primary) hypertension: Secondary | ICD-10-CM | POA: Diagnosis not present

## 2023-11-29 DIAGNOSIS — R413 Other amnesia: Secondary | ICD-10-CM | POA: Diagnosis not present

## 2023-11-29 DIAGNOSIS — I471 Supraventricular tachycardia, unspecified: Secondary | ICD-10-CM | POA: Diagnosis not present

## 2023-12-04 DIAGNOSIS — R413 Other amnesia: Secondary | ICD-10-CM | POA: Diagnosis not present

## 2023-12-04 DIAGNOSIS — I471 Supraventricular tachycardia, unspecified: Secondary | ICD-10-CM | POA: Diagnosis not present

## 2023-12-04 DIAGNOSIS — I10 Essential (primary) hypertension: Secondary | ICD-10-CM | POA: Diagnosis not present

## 2023-12-04 DIAGNOSIS — N1831 Chronic kidney disease, stage 3a: Secondary | ICD-10-CM | POA: Diagnosis not present

## 2023-12-16 ENCOUNTER — Encounter: Payer: Self-pay | Admitting: Physician Assistant

## 2023-12-16 ENCOUNTER — Ambulatory Visit: Admitting: Physician Assistant

## 2023-12-16 VITALS — BP 118/53 | HR 71 | Resp 20 | Wt 165.0 lb

## 2023-12-16 DIAGNOSIS — G3184 Mild cognitive impairment, so stated: Secondary | ICD-10-CM

## 2023-12-16 DIAGNOSIS — R413 Other amnesia: Secondary | ICD-10-CM

## 2023-12-16 MED ORDER — DONEPEZIL HCL 10 MG PO TABS: ORAL_TABLET | ORAL | 3 refills | Status: DC

## 2023-12-16 MED ORDER — DONEPEZIL HCL 5 MG PO TABS: 5.0000 mg | ORAL_TABLET | Freq: Every day | ORAL | 0 refills | Status: DC

## 2023-12-16 MED ORDER — DONEPEZIL HCL 5 MG PO TABS: ORAL_TABLET | ORAL | 0 refills | Status: DC

## 2023-12-16 MED ORDER — DONEPEZIL HCL 10 MG PO TABS: ORAL_TABLET | ORAL | 3 refills | Status: AC

## 2023-12-16 NOTE — Patient Instructions (Addendum)
 It was a pleasure to see you today at our office.   Recommendations:  Donepezil 5 mg daily for 1 month, then increase to 10 mg daily  MRI of the brain, the radiology office will call you to arrange you appointment [phone] Follow up in 6 months   For psychiatric meds, mood meds: Please have your primary care physician manage these medications.  If you have any severe symptoms of a stroke, or other severe issues such as confusion,severe chills or fever, etc call 911 or go to the ER as you may need to be evaluated further     For assessment of decision of mental capacity and competency:  Call Dr. Laverne Potter, geriatric psychiatrist at [phone]  Counseling regarding caregiver distress, including caregiver depression, anxiety and issues regarding community resources, adult day care programs, adult living facilities, or memory care questions:  please contact your  Primary Doctor's Social Worker   Whom to call: Memory  decline, memory medications: Call our office [phone]    [email or web address]   RECOMMENDATIONS FOR ALL PATIENTS WITH MEMORY PROBLEMS: 1. Continue to exercise (Recommend 30 minutes of walking everyday, or 3 hours every week) 2. Increase social interactions - continue going to [place] and enjoy social gatherings with friends and family 3. Eat healthy, avoid fried foods and eat more fruits and vegetables 4. Maintain adequate blood pressure, blood sugar, and blood cholesterol level. Reducing the risk of stroke and cardiovascular disease also helps promoting better memory. 5. Avoid stressful situations. Live a simple life and avoid aggravations. Organize your time and prepare for the next day in anticipation. 6. Sleep well, avoid any interruptions of sleep and avoid any distractions in the bedroom that may interfere with adequate sleep quality 7. Avoid sugar, avoid sweets as there is a strong link between  excessive sugar intake, diabetes, and cognitive impairment We discussed the Mediterranean diet, which has been shown to help patients reduce the risk of progressive memory disorders and reduces cardiovascular risk. This includes eating fish, eat fruits and green leafy vegetables, nuts like almonds and hazelnuts, walnuts, and also use olive oil. Avoid fast foods and fried foods as much as possible. Avoid sweets and sugar as sugar use has been linked to worsening of memory function.  There is always a concern of gradual progression of memory problems. If this is the case, then we may need to adjust level of care according to patient needs. Support, both to the patient and caregiver, should then be put into place.        DRIVING: Regarding driving, in patients with progressive memory problems, driving will be impaired. We advise to have someone else do the driving if trouble finding directions or if minor accidents are reported. Independent driving assessment is available to determine safety of driving.   If you are interested in the driving assessment, you can contact the following:  [facility] [employer] in [place] [phone]  [facility] [phone]  [facility] [phone]  [facility] [phone] or [phone]   FALL PRECAUTIONS: Be cautious when walking. Scan the area for obstacles that may increase the risk of trips and falls. When getting up in the mornings, sit up at the edge of the bed for a few minutes before getting out of bed. Consider elevating the bed at the head end to avoid drop of blood pressure when getting up. Walk always in a well-lit room (use night lights in the walls). Avoid area rugs or power cords from appliances in  the middle of the walkways. Use a walker or a cane if necessary and consider physical therapy for balance exercise. Get your eyesight checked regularly.  FINANCIAL OVERSIGHT: Supervision, especially oversight  when making financial decisions or transactions is also recommended.  HOME SAFETY: Consider the safety of the kitchen when operating appliances like stoves, microwave oven, and blender. Consider having supervision and share cooking responsibilities until no longer able to participate in those. Accidents with firearms and other hazards in the house should be identified and addressed as well.   ABILITY TO BE LEFT ALONE: If patient is unable to contact 911 operator, consider using LifeLine, or when the need is there, arrange for someone to stay with patients. Smoking is a fire hazard, consider supervision or cessation. Risk of wandering should be assessed by caregiver and if detected at any point, supervision and safe proof recommendations should be instituted.  MEDICATION SUPERVISION: Inability to self-administer medication needs to be constantly addressed. Implement a mechanism to ensure safe administration of the medications.      Mediterranean Diet A Mediterranean diet refers to food and lifestyle choices that are based on the traditions of countries located on the [employer]. This way of eating has been shown to help prevent certain conditions and improve outcomes for people who have chronic diseases, like kidney disease and heart disease. What are tips for following this plan? Lifestyle  Cook and eat meals together with your family, when possible. Drink enough fluid to keep your urine clear or pale yellow. Be physically active every day. This includes: Aerobic exercise like running or swimming. Leisure activities like gardening, walking, or housework. Get 7-8 hours of sleep each night. If recommended by your health care provider, drink red wine in moderation. This means 1 glass a day for nonpregnant women and 2 glasses a day for men. A glass of wine equals 5 oz (150 mL). Reading food labels  Check the serving size of packaged foods. For foods such as rice and pasta, the serving  size refers to the amount of cooked product, not dry. Check the total fat in packaged foods. Avoid foods that have saturated fat or trans fats. Check the ingredients list for added sugars, such as corn syrup. Shopping  At the grocery store, buy most of your food from the areas near the walls of the store. This includes: Fresh fruits and vegetables (produce). Grains, beans, nuts, and seeds. Some of these may be available in unpackaged forms or large amounts (in bulk). Fresh seafood. Poultry and eggs. Low-fat dairy products. Buy whole ingredients instead of prepackaged foods. Buy fresh fruits and vegetables in-season from local farmers markets. Buy frozen fruits and vegetables in resealable bags. If you do not have access to quality fresh seafood, buy precooked frozen shrimp or canned fish, such as tuna, salmon, or sardines. Buy small amounts of raw or cooked vegetables, salads, or olives from the deli or salad bar at your store. Stock your pantry so you always have certain foods on hand, such as olive oil, canned tuna, canned tomatoes, rice, pasta, and beans. Cooking  Cook foods with extra-virgin olive oil instead of using butter or other vegetable oils. Have meat as a side dish, and have vegetables or grains as your main dish. This means having meat in small portions or adding small amounts of meat to foods like pasta or stew. Use beans or vegetables instead of meat in common dishes like chili or lasagna. Experiment with different cooking methods. Try roasting or broiling  vegetables instead of steaming or sauteing them. Add frozen vegetables to soups, stews, pasta, or rice. Add nuts or seeds for added healthy fat at each meal. You can add these to yogurt, salads, or vegetable dishes. Marinate fish or vegetables using olive oil, lemon juice, garlic, and fresh herbs. Meal planning  Plan to eat 1 vegetarian meal one day each week. Try to work up to 2 vegetarian meals, if possible. Eat seafood  2 or more times a week. Have healthy snacks readily available, such as: Vegetable sticks with hummus. Greek yogurt. Fruit and nut trail mix. Eat balanced meals throughout the week. This includes: Fruit: 2-3 servings a day Vegetables: 4-5 servings a day Low-fat dairy: 2 servings a day Fish, poultry, or lean meat: 1 serving a day Beans and legumes: 2 or more servings a week Nuts and seeds: 1-2 servings a day Whole grains: 6-8 servings a day Extra-virgin olive oil: 3-4 servings a day Limit red meat and sweets to only a few servings a month What are my food choices? Mediterranean diet Recommended Grains: Whole-grain pasta. Brown rice. Bulgar wheat. Polenta. Couscous. Whole-wheat bread. Dwyane Glad. Vegetables: Artichokes. Beets. Broccoli. Cabbage. Carrots. Eggplant. Green beans. Chard. Kale. Spinach. Onions. Leeks. Peas. Squash. Tomatoes. Peppers. Radishes. Fruits: Apples. Apricots. Avocado. Berries. Bananas. Cherries. Dates. Figs. Grapes. Lemons. Melon. Oranges. Peaches. Plums. Pomegranate. Meats and other protein foods: Beans. Almonds. Sunflower seeds. Pine nuts. Peanuts. Cod. Salmon. Scallops. Shrimp. Tuna. Tilapia. Clams. Oysters. Eggs. Dairy: Low-fat milk. Cheese. Greek yogurt. Beverages: Water. Red wine. Herbal tea. Fats and oils: Extra virgin olive oil. Avocado oil. Grape seed oil. Sweets and desserts: [place] yogurt with honey. Baked apples. Poached pears. Trail mix. Seasoning and other foods: Basil. Cilantro. Coriander. Cumin. Mint. Parsley. Sage. Rosemary. Tarragon. Garlic. Oregano. Thyme. Pepper. Balsalmic vinegar. Tahini. Hummus. Tomato sauce. Olives. Mushrooms. Limit these Grains: Prepackaged pasta or rice dishes. Prepackaged cereal with added sugar. Vegetables: Deep fried potatoes (french fries). Fruits: Fruit canned in syrup. Meats and other protein foods: Beef. Pork. Lamb. Poultry with skin. Hot dogs. Helene Loader. Dairy: Ice cream. Sour cream. Whole milk. Beverages: Juice.  Sugar-sweetened soft drinks. Beer. Liquor and spirits. Fats and oils: Butter. Canola oil. Vegetable oil. Beef fat (tallow). Lard. Sweets and desserts: Cookies. Cakes. Pies. Candy. Seasoning and other foods: Mayonnaise. Premade sauces and marinades. The items listed may not be a complete list. Talk with your dietitian about what dietary choices are right for you. Summary The Mediterranean diet includes both food and lifestyle choices. Eat a variety of fresh fruits and vegetables, beans, nuts, seeds, and whole grains. Limit the amount of red meat and sweets that you eat. Talk with your health care provider about whether it is safe for you to drink red wine in moderation. This means 1 glass a day for nonpregnant women and 2 glasses a day for men. A glass of wine equals 5 oz (150 mL). This information is not intended to replace advice given to you by your health care provider. Make sure you discuss any questions you have with your health care provider. Document Released: 02/23/2016 Document Revised: 03/27/2016 Document Reviewed: 02/23/2016 Elsevier Interactive Patient Education  2017 [employer].

## 2023-12-16 NOTE — Progress Notes (Signed)
 Assessment/Plan:    Mild cognitive impairment, not amnestic, likely of vascular etiology  Helen Stanton is a very pleasant 78 y.o. RH female with a history of hypertension, hyperlipidemia, asthma, CHF, Afib on AC, COPD on nightly oxygen, history of PE, CKD3, prediabetes, microcytic anemia, and a diagnosis of mild cognitive impairment per neuropsych evaluation March 2025, presenting today in follow-up for evaluation of memory loss. She is yet to have her MRI brain performed, we discussed rescheduling it for better visualization of structural abnormalities and vascular load.  Patient is not on antidementia medication at this time, discussed initiating donepezil   she agrees to proceed.      Recommendations:   Follow up in  6 months. Repeat neuropsych evaluation in 9-15 months MRI brain  for better visualization of structural abnormalities and vascular load Start donepezil 10  mg daily as directed, side effects discussed . Recommend good control of cardiovascular risk factors Continue to control mood as per PCP    Subjective:   This patient is here alone. Previous records as well as any outside records available were reviewed prior to todays visit.   Patient was last seen on 09/18/2023 with [facility] 20/30 .    Any changes in memory since last visit? " About the same".  She continues to have issues with short-term memory, by having difficulties with new information, recent conversations and names.  LTM is affected as well per patient's report. Likes to do crossword puzzles.  repeats oneself?  Endorsed Disoriented when walking into a room?  Patient denies    Misplacing objects?  Patient denies   Wandering behavior?   Denies. Any personality changes since last visit? Less stressed out since daughter moved out.  Any worsening depression?: denies.   Hallucinations or paranoia?  Denies.   Seizures?   Denies.    Any sleep changes?"Some days I sleep better than others". Denies vivid dreams, REM  behavior or sleepwalking   Sleep apnea?   denies    Any hygiene concerns?   Denies.   Independent of bathing and dressing?  Endorsed  Does the patient needs help with medications? Patient is in charge.   Who is in charge of the finances?  Patient and daughter in [facility]) are in charge     Any changes in appetite?  Denies.     Patient have trouble swallowing?  Denies.   Does the patient cook? Yes   Any kitchen accidents such as leaving the stove on?   Denies.   Any headaches?    Denies.   Vision changes? Denies.  She has a history of glaucoma. Chronic pain?  Denies.   Ambulates with difficulty?    Denies.    Recent falls or head injuries?    Denies.      Unilateral weakness, numbness or tingling?  Denies.   Any tremors?  Endorsed, occasional.  Sometimes she reports difficulty picking up heavy objects or signing her name. Any anosmia?    This is chronic, for at least 19 years, attributed to having worked in the [employer]. "It is coming back slowly". Any incontinence of urine?  Urge incontinence.  Any bowel dysfunction?  Denies.      Patient lives alone.  Her daughter and her children moved out in March..  Does the patient drive?.  This, she denies any issues   Neuropsych evaluation 09/18/2023  Dr. Donavon Fudge: briefly, results indicated a primarily frontal-subcortical pattern of deficits, though deficits on select tasks of visuospatial function were  also present. Other areas of cognitive functioning remained intact. 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 Patient has several vascular risk factors, which is the most likely etiology explaining the deficits identified today. However, upcoming MRI will help clarify if cerebrovascular disease is present and rule in/out other intracranial abnormalities. Other possible factors contributing to the overall clinical picture include mood (i.e.,  mild anxiety) and grief (i.e., recent passing of several siblings). Serial assessment will be most beneficial in clarifying the underlying etiology, monitoring her course, and adapting the treatment plan over time.   Initial evaluation March 2025 How long did patient have memory difficulties?  For about 3 years,worse over the last year "-daughter agrees.  Patient reports some difficulty remembering new information, recent conversations, names. LTM is affected as well according to her.  repeats oneself?  Endorsed by her daughter. Disoriented when walking into a room?  Patient denies.    Leaving objects in unusual places?  denies   Wandering behavior? denies   Any personality changes, or depression, anxiety?  She has lost 3 siblings in 2 years, and her husband died in 10/11/2024which brought significant situational depression.  In addition her daughter reports that "She came here in 2007 after her daughter had gotten divorced, the kids got older and she took more that she should have.  The children are currently 13, 10, 8 and 7". Daughter believes that this may have aggravated the symptoms, to the point that she could not return home and deal with it, she was spending most of the time outside.  "She is a home buddy and could not be at home".  At church she was lacking participation, "fell  off, she was no longer going to [place]".  Her daughter reports that recently, she met "this guy in [place], who is 30 years her junior, who may be more interested in her finances and wanted to move with her "for which the family had to intervene. Hallucinations or paranoia? denies   Seizures? denies    Any sleep changes?   Does not sleep well for the last 6 months.  Denies vivid dreams, REM behavior or sleepwalking   Sleep apnea? Denies.   Any hygiene concerns?  Denies.   Independent of bathing and dressing? Endorsed  Does the patient need help with medications? Patient is in charge   Who is in charge of the finances?  Daughter and patient  going it together after patient purchasing solar panels for her home    Any changes in appetite?   Denies.     Patient have trouble swallowing?  Denies.   Does the patient cook? Yes, denies kitchen accidents Any headaches?  Denies.   Chronic pain? Denies.   Ambulates with difficulty? Denies    Recent falls or head injuries? Denies.     Vision changes?  "My vision changed, need new glasses".  Has a history of glaucoma Any strokelike symptoms? Denies.   Any tremors? Occasionally. Sometimes she has difficulty picking up heavy objects and signing her name.    Any anosmia? Endorsed as well as decreased taste for at least 18 years "because I worked with bleach, had polyp surgery".   Any incontinence of urine? Denies.   Any bowel dysfunction? Denies.      Patient lives alone, until 1 month ago she was living with her other daughter and part time with her grandchildren "which may have been  part of the problem"-daughter says   History of heavy alcohol intake? Denies.   History of heavy tobacco use? Denies.   Family history of dementia? Mother had vascular dementia. One sister with vascular dementia  Does patient drive? Yes, denies any issues Retired [employer]ner, broadcasting/film[employer]video, 2023.    Past Medical History:  Diagnosis Date   Asthma    Atrial fibrillation [facility])    Atrial fibrillation with RVR (HCC) 02/14/2021   History of PSVT (paroxysmal supraventricular tachycardia)    HTN (hypertension) 01/08/2012   Hypercholesterolemia    Hyperlipidemia    Hypertension    Medication management 06/04/2013   Osteopenia of spine    Pulmonary embolism (HCC) 02/14/2021   Pulmonary nodule    SVT (supraventricular tachycardia) (HCC)      Past Surgical History:  Procedure Laterality Date   TUBAL LIGATION  circa 1975     PREVIOUS MEDICATIONS:   CURRENT MEDICATIONS:  Outpatient Encounter Medications as of 12/16/2023  Medication Sig   albuterol  (PROVENTIL  HFA;VENTOLIN  HFA) 108 (90 BASE) MCG/ACT inhaler  Inhale 2 puffs into the lungs every 6 (six) hours as needed for wheezing or shortness of breath.   amiodarone  (PACERONE ) 200 MG tablet Take 1 tablet (200 mg total) by mouth daily.   apixaban  (ELIQUIS ) 5 MG TABS tablet Take 1 tablet (5 mg total) by mouth 2 (two) times daily.   fluticasone  (FLONASE) 50 MCG/ACT nasal spray Place 1 spray into both nostrils 2 (two) times daily as needed for allergies or rhinitis.   fluticasone -salmeterol (ADVAIR  HFA) 230-21 MCG/ACT inhaler Inhale 2 puffs into the lungs 2 (two) times daily.   Metoprolol  Tartrate 37.5 MG TABS Take 1 tablet (37.5 mg total) by mouth 2 (two) times daily.   montelukast  (SINGULAIR ) 10 MG tablet Take 1 tablet by mouth at bedtime.   spironolactone (ALDACTONE) 50 MG tablet Take 50 mg by mouth daily.   torsemide (DEMADEX) 20 MG tablet Take 20 mg by mouth daily as needed (for edema or fluid retention).   valsartan-hydrochlorothiazide  (DIOVAN-HCT) 320-12.5 MG tablet Take 1 tablet by mouth daily.   [DISCONTINUED] donepezil (ARICEPT) 10 MG tablet Take one tab daily   [DISCONTINUED] donepezil (ARICEPT) 5 MG tablet Take one tab daily   [DISCONTINUED] donepezil (ARICEPT) 5 MG tablet Take 1 tablet (5 mg total) by mouth daily.   atorvastatin  (LIPITOR) 40 MG tablet Take 1 tablet by mouth daily.   donepezil (ARICEPT) 10 MG tablet Take half tab for 2 weeks, then increase to 1 tab daily   [DISCONTINUED] donepezil (ARICEPT) 10 MG tablet Take one tab daily   No facility-administered encounter medications on file as of 12/16/2023.     Objective:     PHYSICAL EXAMINATION:    VITALS:   Vitals:   12/16/23 0936  BP: (!) 118/53  Pulse: 71  Resp: 20  SpO2: 95%  Weight: 165 lb (74.8 kg)    GEN:  The patient appears stated age and is in NAD. HEENT:  Normocephalic, atraumatic.   Neurological examination:  General: NAD, well-groomed, appears stated age. Orientation: The patient is alert. Oriented to person, place and not to date.  Cranial nerves:  There is good facial symmetry.The speech is fluent and clear. No aphasia or dysarthria. Fund of knowledge is appropriate. Recent memory impaired and remote memory is normal.  Attention and concentration are reduced.  Able to name objects and repeat phrases.  Hearing is intact to conversational tone.     Sensation: Sensation is intact to light touch throughout Motor: Strength is  at least antigravity x4. DTR's 2/4 in UE/LE      09/18/2023    8:00 AM  Montreal Cognitive Assessment   Visuospatial/ Executive (0/5) 3  Naming (0/3) 3  Attention: Read list of digits (0/2) 2  Attention: Read list of letters (0/1) 1  Attention: Serial 7 subtraction starting at 100 (0/3) 0  Language: Repeat phrase (0/2) 1  Language : Fluency (0/1) 1  Abstraction (0/2) 0  Delayed Recall (0/5) 4  Orientation (0/6) 5  Total 20  Adjusted Score (based on education) 20        No data to display             Movement examination: Tone: There is normal tone in the UE/LE Abnormal movements: Minimal intention tremor on the left upper extremity, no resting tremor.  No myoclonus.  No asterixis.   Coordination:  There is no decremation with RAM's. Normal finger to nose  Gait and Station: The patient has no difficulty arising out of a deep-seated chair without the use of the hands. The patient's stride length is good.  Gait is cautious and narrow.   Thank you for allowing us  the opportunity to participate in the care of this nice patient. Please do not hesitate to contact us  for any questions or concerns.   Total time spent on today's visit was 31 minutes dedicated to this patient today, preparing to see patient, examining the patient, ordering tests and/or medications and counseling the patient, documenting clinical information in the EHR or other health record, independently interpreting results and communicating results to the patient/family, discussing treatment and goals, answering patient's questions and coordinating  care.  Cc:  Merl Star, MD  Tex Filbert 12/16/2023 7:58 PM

## 2023-12-18 ENCOUNTER — Encounter: Payer: Self-pay | Admitting: Physician Assistant

## 2023-12-20 ENCOUNTER — Ambulatory Visit
Admission: RE | Admit: 2023-12-20 | Discharge: 2023-12-20 | Disposition: A | Discharge status: Home / Self Care | Source: Ambulatory Visit | Attending: Physician Assistant | Admitting: Physician Assistant

## 2023-12-20 DIAGNOSIS — G319 Degenerative disease of nervous system, unspecified: Secondary | ICD-10-CM | POA: Diagnosis not present

## 2023-12-20 DIAGNOSIS — I6782 Cerebral ischemia: Secondary | ICD-10-CM | POA: Diagnosis not present

## 2023-12-20 DIAGNOSIS — R413 Other amnesia: Secondary | ICD-10-CM | POA: Diagnosis not present

## 2023-12-23 ENCOUNTER — Ambulatory Visit: Payer: Self-pay | Admitting: Physician Assistant

## 2023-12-23 NOTE — Progress Notes (Signed)
 No answer at 1:16pm 12/23/2023

## 2023-12-26 ENCOUNTER — Telehealth: Payer: Self-pay | Admitting: Physician Assistant

## 2023-12-26 NOTE — Telephone Encounter (Signed)
 Pt's daughter called in stating the medication Aricept  was prescribed for the pt and she is unaware of what it is for?

## 2024-03-12 ENCOUNTER — Ambulatory Visit: Admitting: Pulmonary Disease

## 2024-03-17 ENCOUNTER — Ambulatory Visit: Admitting: Physician Assistant

## 2024-04-22 ENCOUNTER — Ambulatory Visit (INDEPENDENT_AMBULATORY_CARE_PROVIDER_SITE_OTHER): Admitting: Pulmonary Disease

## 2024-04-22 ENCOUNTER — Encounter: Payer: Self-pay | Admitting: Pulmonary Disease

## 2024-04-22 VITALS — BP 150/84 | HR 74 | Temp 97.8°F | Ht 61.0 in | Wt 167.0 lb

## 2024-04-22 DIAGNOSIS — J4540 Moderate persistent asthma, uncomplicated: Secondary | ICD-10-CM

## 2024-04-22 LAB — CBC WITH DIFFERENTIAL/PLATELET
Basophils Absolute: 0 K/uL (ref 0.0–0.1)
Basophils Relative: 0.5 % (ref 0.0–3.0)
Eosinophils Absolute: 0.3 K/uL (ref 0.0–0.7)
Eosinophils Relative: 4.1 % (ref 0.0–5.0)
HCT: 36.1 % (ref 36.0–46.0)
Hemoglobin: 11.1 g/dL — ABNORMAL LOW (ref 12.0–15.0)
Lymphocytes Relative: 19.2 % (ref 12.0–46.0)
Lymphs Abs: 1.4 K/uL (ref 0.7–4.0)
MCHC: 30.8 g/dL (ref 30.0–36.0)
MCV: 77.7 fl — ABNORMAL LOW (ref 78.0–100.0)
Monocytes Absolute: 0.6 K/uL (ref 0.1–1.0)
Monocytes Relative: 7.6 % (ref 3.0–12.0)
Neutro Abs: 5.1 K/uL (ref 1.4–7.7)
Neutrophils Relative %: 68.6 % (ref 43.0–77.0)
Platelets: 203 K/uL (ref 150.0–400.0)
RBC: 4.65 Mil/uL (ref 3.87–5.11)
RDW: 15.4 % (ref 11.5–15.5)
WBC: 7.5 K/uL (ref 4.0–10.5)

## 2024-04-22 MED ORDER — ALBUTEROL SULFATE HFA 108 (90 BASE) MCG/ACT IN AERS: 2.0000 | INHALATION_SPRAY | Freq: Four times a day (QID) | RESPIRATORY_TRACT | 5 refills | Status: AC | PRN

## 2024-04-22 MED ORDER — MONTELUKAST SODIUM 10 MG PO TABS: 10.0000 mg | ORAL_TABLET | Freq: Every day | ORAL | 5 refills | Status: AC

## 2024-04-22 MED ORDER — FLUTICASONE-SALMETEROL 230-21 MCG/ACT IN AERO: 2.0000 | INHALATION_SPRAY | Freq: Two times a day (BID) | RESPIRATORY_TRACT | 5 refills | Status: AC

## 2024-04-22 NOTE — Patient Instructions (Signed)
  VISIT SUMMARY: Today, we reviewed your asthma, pulmonary imaging findings, and cardiac health. Your asthma is well-controlled with your current medications, and we discussed ongoing management for your lung nodule and scarring, as well as your atrial fibrillation and pulmonary embolism.  YOUR PLAN: ASTHMA: Your asthma is well controlled with your current medications: Advair , montelukast , and occasional use of albuterol . -Continue taking Advair , montelukast , and albuterol  as needed. -Your prescriptions for Advair  and montelukast  have been refilled. -We will order baseline labs including blood count and IgE to check for allergic inflammation. -We will also order lung function testing and a follow-up scan to evaluate your lung nodules and scarring.  MILD EOSINOPHILIA: You have mild eosinophilia, likely related to your asthma and allergies. -We will monitor this by ordering baseline labs including blood count and IgE to assess for allergic inflammation.  RIGHT LOWER LOBE LUNG NODULE AND SUBPLEURAL SCARRING: You have a lung nodule and mild subpleural scarring in your right lung. -We will order a follow-up scan to monitor any changes in your lung nodules and scarring.  ATRIAL FIBRILLATION: You have a history of atrial fibrillation, which is being managed with anticoagulation therapy. -Continue your current anticoagulation therapy as prescribed.  PULMONARY EMBOLISM: You had a pulmonary embolism in your right lower lobe, which is being managed with anticoagulation therapy. -Continue your current anticoagulation therapy as prescribed.

## 2024-04-22 NOTE — Progress Notes (Signed)
 Helen Stanton    [id number]    May 25, 1946  Primary Care Physician:Polite, Tanda, MD  Referring Physician: Rexanne Tanda, MD [address] [place],  [place] [address]  Chief complaint: Consult for asthma  HPI: 78 y.o. who  has a past medical history of Asthma, Atrial fibrillation (HCC), Atrial fibrillation with RVR (HCC) (02/14/2021), History of PSVT (paroxysmal supraventricular tachycardia), HTN (hypertension) (01/08/2012), Hypercholesterolemia, Hyperlipidemia, Hypertension, Medication management (06/04/2013), Osteopenia of spine, Pulmonary embolism (HCC) (02/14/2021), Pulmonary nodule, and SVT (supraventricular tachycardia).  Discussed the use of AI scribe software for clinical note transcription with the patient, who gave verbal consent to proceed.  History of Present Illness Helen Stanton is an 78 year old female with asthma and atrial fibrillation who presents for pulmonary care establishment.  Asthma control and respiratory symptoms - Asthma is well-controlled with Advair  and montelukast . - Albuterol  is used infrequently. - Last required systemic corticosteroids in 2023 during a hospitalization. - No current respiratory symptoms reported. - Allergic to feather pillows. - No exposure to pets or occupational hazards.  Pulmonary imaging findings - Mild subpleural scarring in the right lung identified on imaging. - Lung nodule noted on a scan in 2023.  Pulmonary embolism - Right lower lobe segmental and subsegmental pulmonary embolism identified in August 2025 during hospitalization for atrial fibrillation. - Currently on anticoagulation therapy.  Atrial fibrillation and cardiac management - History of atrial fibrillation. - Hospitalized in August 2025 for atrial fibrillation. - Currently taking amiodarone .  Relevant Pulmonary history: Pets: Occupation: Exposures: No h/o chemo/XRT/amiodarone /macrodantin/MTX  No exposure to asbestos, silica or other  organic allergens  Smoking history: Travel history: Family history:   Outpatient Encounter Medications as of 04/22/2024  Medication Sig   albuterol  (PROVENTIL  HFA;VENTOLIN  HFA) 108 (90 BASE) MCG/ACT inhaler Inhale 2 puffs into the lungs every 6 (six) hours as needed for wheezing or shortness of breath.   amiodarone  (PACERONE ) 200 MG tablet Take 1 tablet (200 mg total) by mouth daily.   apixaban  (ELIQUIS ) 5 MG TABS tablet Take 1 tablet (5 mg total) by mouth 2 (two) times daily.   atorvastatin  (LIPITOR) 40 MG tablet Take 1 tablet by mouth daily.   donepezil  (ARICEPT ) 10 MG tablet Take half tab for 2 weeks, then increase to 1 tab daily   fluticasone  (FLONASE) 50 MCG/ACT nasal spray Place 1 spray into both nostrils 2 (two) times daily as needed for allergies or rhinitis.   fluticasone -salmeterol (ADVAIR  HFA) 230-21 MCG/ACT inhaler Inhale 2 puffs into the lungs 2 (two) times daily.   Metoprolol  Tartrate 37.5 MG TABS Take 1 tablet (37.5 mg total) by mouth 2 (two) times daily.   montelukast  (SINGULAIR ) 10 MG tablet Take 1 tablet by mouth at bedtime.   spironolactone (ALDACTONE) 50 MG tablet Take 50 mg by mouth daily.   torsemide (DEMADEX) 20 MG tablet Take 20 mg by mouth daily as needed (for edema or fluid retention).   valsartan-hydrochlorothiazide  (DIOVAN-HCT) 320-12.5 MG tablet Take 1 tablet by mouth daily.   No facility-administered encounter medications on file as of 04/22/2024.     Physical Exam: Today's Vitals   04/22/24 0823  BP: (!) 150/84  Pulse: 74  Temp: 97.8 F (36.6 C)  TempSrc: Oral  SpO2: 98%  Weight: 167 lb (75.8 kg)  Height: 5' 1 (1.549 m)   Body mass index is 31.55 kg/m.  Physical Exam GEN: No acute distress. CV: Regular rate and rhythm, no murmurs. LUNGS: Clear to auscultation bilaterally, normal  respiratory effort. SKIN JOINTS: Warm and dry, no rash.    Data Reviewed: Imaging: CTA 02/14/2021-right lower lobe segmental and subsegmental pulmonary embolism.   Trace right pleural effusion with patchy airspace disease.  5 mm pulmonary nodule. I have reviewed the images personally.  CT chest report from [facility] 09/07/2021-mild subpleural scarring in the right upper lobe, mild scarring in the right base  PFTs: Report from [facility] 04/11/2023 FVC: 2.19 (126% predicted), FEV1: 1.42 (107% predicted), Ratio: 0.65, DLCO: 12.1 (66% predicted)  Spirometry and flow volume loop show mild airflow obstruction.   The carbon monoxide diffusing capacity is mildly reduced and is uncorrected for Hgb.  Labs: 02/17/2021-WBC 8.8, eos 3%, absolute eosinophil count 264 Assessment & Plan Asthma, well controlled Asthma is well controlled with current medication regimen, including Advair , montelukast , and occasional use of albuterol . No recent exacerbations or need for steroids since 2023. - Continue current asthma medications: Advair , montelukast , and albuterol  as needed - Refill prescriptions for Advair  and montelukast  - Order baseline labs including blood count and IgE to assess for allergic inflammation - Order lung function testing and a follow-up scan to evaluate lung nodules and scarring   Right lower lobe lung nodule and subpleural scarring Right lower lobe lung nodule [appears to have resolved on subsequent scans] and mild subpleural scarring observed in the right lung on previous scans. Surveillance is ongoing to monitor any changes. - Order follow-up CT scan chest to evaluate lung nodules and scarring  Atrial fibrillation, heart failure Atrial fibrillation managed with anticoagulation therapy, amiodarone , diuretics Review CT scan chest for any interstitial lung disease related to amiodarone   Pulmonary embolism during hospitalization in 2022 Right lower lobe pulmonary embolism managed with anticoagulation.  Recommendations: Continue Advair , montelukast , albuterol  as needed Check CBC differential, IgE CT chest, PFTs  I personally spent a total of 45 minutes  in the care of the patient today including preparing to see the patient, getting/reviewing separately obtained history, performing a medically appropriate exam/evaluation, placing orders, independently interpreting results, and communicating results.   Helen Lavallie MD [facility] 04/22/2024, 8:52 AM  CC: Rexanne Ingle, MD  Right I am out of her chart

## 2024-04-23 LAB — IGE: IgE (Immunoglobulin E), Serum: 389 kU/L — ABNORMAL HIGH (ref <=114)

## 2024-05-05 ENCOUNTER — Ambulatory Visit: Payer: Self-pay | Admitting: Pulmonary Disease

## 2024-05-05 DIAGNOSIS — H5202 Hypermetropia, left eye: Secondary | ICD-10-CM | POA: Diagnosis not present

## 2024-05-05 NOTE — Telephone Encounter (Addendum)
-----   Message from [facility] sent at 05/05/2024 11:33 AM EDT ----- Labs show slight elevation in IgE and eosinophils which is consistent with asthma and allergies.  Continue current therapy.  Called- Patient did not answer.x2. Mailbox is full.. Sent message via [email or web address]

## 2024-05-06 ENCOUNTER — Ambulatory Visit
Admission: RE | Admit: 2024-05-06 | Discharge: 2024-05-06 | Disposition: A | Discharge status: Home / Self Care | Source: Ambulatory Visit | Attending: Pulmonary Disease | Admitting: Pulmonary Disease

## 2024-05-06 DIAGNOSIS — R918 Other nonspecific abnormal finding of lung field: Secondary | ICD-10-CM | POA: Diagnosis not present

## 2024-05-06 DIAGNOSIS — J4540 Moderate persistent asthma, uncomplicated: Secondary | ICD-10-CM

## 2024-05-19 ENCOUNTER — Other Ambulatory Visit: Payer: Self-pay | Admitting: Internal Medicine

## 2024-05-19 DIAGNOSIS — R413 Other amnesia: Secondary | ICD-10-CM | POA: Diagnosis not present

## 2024-05-19 DIAGNOSIS — R9389 Abnormal findings on diagnostic imaging of other specified body structures: Secondary | ICD-10-CM

## 2024-05-19 DIAGNOSIS — Z23 Encounter for immunization: Secondary | ICD-10-CM | POA: Diagnosis not present

## 2024-06-03 ENCOUNTER — Ambulatory Visit

## 2024-06-03 ENCOUNTER — Ambulatory Visit
Admission: RE | Admit: 2024-06-03 | Discharge: 2024-06-03 | Disposition: A | Source: Ambulatory Visit | Attending: Internal Medicine | Admitting: Internal Medicine

## 2024-06-03 DIAGNOSIS — R9389 Abnormal findings on diagnostic imaging of other specified body structures: Secondary | ICD-10-CM

## 2024-06-03 DIAGNOSIS — R928 Other abnormal and inconclusive findings on diagnostic imaging of breast: Secondary | ICD-10-CM | POA: Diagnosis not present

## 2024-06-14 NOTE — Progress Notes (Signed)
 Helen Stanton is a very pleasant 78 y.o. RH female with a history of hypertension, hyperlipidemia, asthma, CHF, Afib on AC, COPD on nightly oxygen, history of PE, CKD3, prediabetes, microcytic anemia, and a diagnosis of mild cognitive impairment per neuropsych evaluation March 2025  presenting today in follow-up for evaluation of memory loss. Patient is on donepezil  10 mg daily although compliance is unclear. This patient is here alone. Previous records as well as any outside records available were reviewed prior to todays visit.   Patient was last seen on 12/16/23. Stable MMSE performance 30/30.  Patient is able to participate on ADLs and to drive without difficulties. Mood is good   Assessment & Plan  Mild cognitive impairment, concern for vascular etiology  Mild cognitive impairment with no significant changes since June. Memory remains stable with no acute changes on recent MRI. Neurocognitive testing suggests stability. Multifactorial etiology likely due to cardiovascular risk factors including hypertension, hyperlipidemia, prediabetes, and anemia. Donepezil  10 mg daily prescribed but adherence uncertain.  - Ensure adherence to donepezil  10 mg daily. - Maintain control of cardiovascular risk factors including blood pressure, cholesterol, and glucose levels. - Discuss any mood issues with family physician if she arises. - Will repeat neurocognitive testing in 12 to 18 months.  Recommend good control of cardiovascular risk factors.    Continue to control mood as per PCP      Discussed the use of AI scribe software for clinical note transcription with the patient, who gave verbal consent to proceed.  History of Present Illness  Helen Stanton is an 78 year old female with mild cognitive impairment who presents for follow-up on memory concerns.  There have been no significant changes in her memory since the last visit in June. She continues to experience issues with short-term memory, such  as difficulties with new information and recent conversations, but states that it is 'about the same' as before. She engages in word searches but does not participate in other brain-stimulating activities like table games. No repeating herself, disorientation, misplacing objects in unusual places, wandering behavior, or personality changes.  She is experiencing less stress now that her great-granddaughters and one of her daughters, who were living with her due to her son-in-law's incarceration, have moved out. She is now living alone and reports feeling less stressed due to having fewer obligations. No depression, anxiety, mood changes, hallucinations, paranoia, or seizures. Her sleep is variable, with some nights better than others, but she denies nightmares, vivid dreams, or sleepwalking. She manages her medications independently and shares financial responsibilities with her daughter in [place] . No changes in appetite, swallowing difficulties, or social activity participation. She drives short distances and has arranged her living situation to be within a two-mile radius of essential services.  She reports a slight vision change and recently had an eye examination for her history of glaucoma. No chronic pain, recent falls, head injuries, or stroke symptoms. She experiences occasional tremors, which remain unchanged, and has urge incontinence but no bowel issues. Her sense of smell, previously affected by working in a [employer], is slowly improving.  She is currently taking donepezil , although she was initially unaware of this. The medication was prescribed in June 2025 and has been picked up three times.  MRI brain 12/2023, personally reviewed : without acute findings. Remarkable for mild small vessel disease within  the cerebral white matter, mild generalized cerebral atrophy  Neuropsych evaluation 09/18/2023  Dr. Gayland: briefly, results indicated a primarily frontal-subcortical pattern of  deficits, though deficits on select tasks of visuospatial function were also present. Other areas of cognitive functioning remained intact. 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 Patient has several vascular risk factors, which is the most likely etiology explaining the deficits identified today. However, upcoming MRI will help clarify if cerebrovascular disease is present and rule in/out other intracranial abnormalities. Other possible factors contributing to the overall clinical picture include mood (i.e., mild anxiety) and grief (i.e., recent passing of several siblings). Serial assessment will be most beneficial in clarifying the underlying etiology, monitoring her course, and adapting the treatment plan over time.    Initial evaluation March 2025 How long did patient have memory difficulties?  For about 3 years,worse over the last year -daughter agrees.  Patient reports some difficulty remembering new information, recent conversations, names. LTM is affected as well according to her.  repeats oneself?  Endorsed by her daughter. Disoriented when walking into a room?  Patient denies.    Leaving objects in unusual places?  denies   Wandering behavior? denies   Any personality changes, or depression, anxiety?  She has lost 3 siblings in 2 years, and her husband died in 11-04-2024which brought significant situational depression.  In addition her daughter reports that She came here in 2007 after her daughter had gotten divorced, the kids got older and she took more that she should have.  The children are currently 13, 10, 8 and 7. Daughter believes that this may have aggravated the symptoms, to the point that she could not return home and deal with it, she was spending most of the time outside.  She is a home buddy and could not be at home.  At church she was lacking participation, fell   off, she was no longer going to [employer].  Her daughter reports that recently, she met this guy in [place], who is 30 years her junior, who may be more interested in her finances and wanted to move with her for which the family had to intervene. Hallucinations or paranoia? denies   Seizures? denies    Any sleep changes?   Does not sleep well for the last 6 months.  Denies vivid dreams, REM behavior or sleepwalking   Sleep apnea? Denies.   Any hygiene concerns?  Denies.   Independent of bathing and dressing? Endorsed  Does the patient need help with medications? Patient is in charge   Who is in charge of the finances? Daughter and patient  going it together after patient purchasing solar panels for her home    Any changes in appetite?   Denies.     Patient have trouble swallowing?  Denies.   Does the patient cook? Yes, denies kitchen accidents Any headaches?  Denies.   Chronic pain? Denies.   Ambulates with difficulty? Denies    Recent falls or head injuries? Denies.     Vision changes?  My vision changed, need new glasses.  Has a history of glaucoma Any strokelike symptoms? Denies.   Any tremors? Occasionally. Sometimes she has difficulty picking up heavy objects and signing her name.    Any anosmia? Endorsed as well as decreased taste for at least 18 years because I worked with bleach, had polyp surgery.   Any incontinence of urine? Denies.   Any bowel dysfunction? Denies.      Patient lives alone, until 1 month ago she was living with her other daughter  and part time with her grandchildren which may have been part of the problem-daughter says   History of heavy alcohol intake? Denies.   History of heavy tobacco use? Denies.   Family history of dementia? Mother had vascular dementia. One sister with vascular dementia  Does patient drive? 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    Past Medical History:  Diagnosis Date   Asthma    Atrial fibrillation (HCC)    Atrial  fibrillation with RVR (HCC) 02/14/2021   History of PSVT (paroxysmal supraventricular tachycardia)    HTN (hypertension) 01/08/2012   Hypercholesterolemia    Hyperlipidemia    Hypertension    Medication management 06/04/2013   Osteopenia of spine    Pulmonary embolism (HCC) 02/14/2021   Pulmonary nodule    SVT (supraventricular tachycardia)      Past Surgical History:  Procedure Laterality Date   TUBAL LIGATION  circa 1975         Objective:     PHYSICAL EXAMINATION:    VITALS:   Vitals:   06/17/24 0903 06/17/24 0904  BP: (!) 145/77 (!) 143/76  Pulse: 84   Resp: 20   Weight: 168 lb (76.2 kg)     GEN:  The patient appears stated age and is in NAD. HEENT:  Normocephalic, atraumatic.   Neurological examination:  General: NAD, well-groomed, appears stated age. Orientation: The patient is alert. Oriented to person, place and to date. Cranial nerves: There is good facial symmetry.The speech is fluent and clear. No aphasia or dysarthria. Fund of knowledge is appropriate. Recent memory impaired and remote memory is normal.  Attention and concentration are normal.  Able to name objects and repeat phrases.  Hearing is intact to conversational tone.   Delayed recall 3/3 Sensation: Sensation is intact to light touch throughout Motor: Strength is at least antigravity x4. DTR's 2/4 in UE/LE      09/18/2023    8:00 AM  Montreal Cognitive Assessment   Visuospatial/ Executive (0/5) 3  Naming (0/3) 3  Attention: Read list of digits (0/2) 2  Attention: Read list of letters (0/1) 1  Attention: Serial 7 subtraction starting at 100 (0/3) 0  Language: Repeat phrase (0/2) 1  Language : Fluency (0/1) 1  Abstraction (0/2) 0  Delayed Recall (0/5) 4  Orientation (0/6) 5  Total 20  Adjusted Score (based on education) 20       06/17/2024    9:00 AM  MMSE - Mini Mental State Exam  Orientation to time 5  Orientation to Place 5  Registration 3  Attention/ Calculation 5  Recall 3   Language- name 2 objects 2  Language- repeat 1  Language- follow 3 step command 3  Language- read & follow direction 1  Write a sentence 1  Copy design 1  Total score 30     Results     Movement examination: Tone: There is normal tone in the UE/LE, no cogwheeling Abnormal movements:  minimal intention tremor on the LUE, no resting tremors.  No myoclonus.  No asterixis.   Coordination:  There is no decremation with RAM's. Normal finger to nose  Gait and Station: The patient has no difficulty arising out of a deep-seated chair without the use of the hands. The patient's stride length is good.  Gait is cautious and narrow.   Thank you for allowing us  the opportunity to participate in the care of this nice patient. Please do not hesitate to contact us  for any questions or concerns.  Total time spent on today's visit was 32 minutes dedicated to this patient today, preparing to see patient, examining the patient, ordering tests and/or medications and counseling the patient, documenting clinical information in the EHR or other health record, independently interpreting results and communicating results to the patient/family, discussing treatment and goals, answering patient's questions and coordinating care.  Cc:  Rexanne Ingle, MD  Camie Sevin 06/17/2024 9:25 AM

## 2024-06-15 DIAGNOSIS — I4891 Unspecified atrial fibrillation: Secondary | ICD-10-CM | POA: Diagnosis not present

## 2024-06-15 DIAGNOSIS — I471 Supraventricular tachycardia, unspecified: Secondary | ICD-10-CM | POA: Diagnosis not present

## 2024-06-15 DIAGNOSIS — N1831 Chronic kidney disease, stage 3a: Secondary | ICD-10-CM | POA: Diagnosis not present

## 2024-06-15 DIAGNOSIS — J45909 Unspecified asthma, uncomplicated: Secondary | ICD-10-CM | POA: Diagnosis not present

## 2024-06-15 DIAGNOSIS — E7800 Pure hypercholesterolemia, unspecified: Secondary | ICD-10-CM | POA: Diagnosis not present

## 2024-06-15 DIAGNOSIS — I10 Essential (primary) hypertension: Secondary | ICD-10-CM | POA: Diagnosis not present

## 2024-06-15 DIAGNOSIS — I700 Atherosclerosis of aorta: Secondary | ICD-10-CM | POA: Diagnosis not present

## 2024-06-15 DIAGNOSIS — M8588 Other specified disorders of bone density and structure, other site: Secondary | ICD-10-CM | POA: Diagnosis not present

## 2024-06-15 DIAGNOSIS — G3184 Mild cognitive impairment, so stated: Secondary | ICD-10-CM | POA: Diagnosis not present

## 2024-06-17 ENCOUNTER — Ambulatory Visit: Admitting: Physician Assistant

## 2024-06-17 ENCOUNTER — Encounter: Payer: Self-pay | Admitting: Physician Assistant

## 2024-06-17 VITALS — BP 143/76 | HR 84 | Resp 20 | Wt 168.0 lb

## 2024-06-17 DIAGNOSIS — G3184 Mild cognitive impairment, so stated: Secondary | ICD-10-CM

## 2024-07-22 ENCOUNTER — Ambulatory Visit: Admitting: Pulmonary Disease

## 2024-07-22 ENCOUNTER — Encounter: Payer: Self-pay | Admitting: Pulmonary Disease

## 2024-07-22 VITALS — BP 173/78 | HR 70 | Temp 98.1°F | Ht 61.0 in | Wt 170.0 lb

## 2024-07-22 DIAGNOSIS — I4891 Unspecified atrial fibrillation: Secondary | ICD-10-CM

## 2024-07-22 DIAGNOSIS — K769 Liver disease, unspecified: Secondary | ICD-10-CM

## 2024-07-22 DIAGNOSIS — Z7901 Long term (current) use of anticoagulants: Secondary | ICD-10-CM

## 2024-07-22 DIAGNOSIS — Z86711 Personal history of pulmonary embolism: Secondary | ICD-10-CM | POA: Diagnosis not present

## 2024-07-22 DIAGNOSIS — J4540 Moderate persistent asthma, uncomplicated: Secondary | ICD-10-CM

## 2024-07-22 DIAGNOSIS — J45909 Unspecified asthma, uncomplicated: Secondary | ICD-10-CM | POA: Diagnosis not present

## 2024-07-22 NOTE — Progress Notes (Signed)
 "              Lahoma Constantin    [id number]    [birth year]  Primary Care Physician:Polite, Tanda, MD  Referring Physician: Rexanne Tanda, MD [address] [place],  [place] [address]  Chief complaint: Follow-up for asthma  HPI: 79 y.o. who  has a past medical history of Asthma, Atrial fibrillation (HCC), Atrial fibrillation with RVR (HCC) (02/14/2021), History of PSVT (paroxysmal supraventricular tachycardia), HTN (hypertension) (01/08/2012), Hypercholesterolemia, Hyperlipidemia, Hypertension, Medication management (06/04/2013), Osteopenia of spine, Pulmonary embolism (HCC) (02/14/2021), Pulmonary nodule, and SVT (supraventricular tachycardia).  Discussed the use of AI scribe software for clinical note transcription with the patient, who gave verbal consent to proceed.  History of Present Illness Waylynn Benefiel is an 79 year old female with asthma and atrial fibrillation who presents for pulmonary care establishment.  Asthma control and respiratory symptoms - Asthma is well-controlled with Advair  and montelukast . - Albuterol  is used infrequently. - Last required systemic corticosteroids in 2023 during a hospitalization.  Pulmonary embolism - Right lower lobe segmental and subsegmental pulmonary embolism identified in August 2022 during hospitalization for atrial fibrillation. - Currently on anticoagulation therapy.  Atrial fibrillation and cardiac management - History of atrial fibrillation. - Currently taking amiodarone .  Interim history: Kennethia Lynes is an 79 year old female with asthma and atrial fibrillation who presents for a pulmonary follow-up.  Asthma control and pulmonary findings - Asthma is well controlled on Advair  twice daily, albuterol  as needed, and montelukast . - No worsening respiratory symptoms. - Recent laboratory studies showed elevated IgE and eosinophils. - Recent CT chest performed to evaluate for scarring or interstitial lung disease showed lungs  with only air trapping changes and no significant interstitial abnormalities.  It does note right breast changes for which she had a follow-up mammogram and also liver changes.  Cardiac history and anticoagulation - Atrial fibrillation managed with anticoagulation and amiodarone . - History of prior blood clot. - No respiratory issues related to cardiac medications.  Relevant Pulmonary history: Pets: No pets Occupation: Retired immunologist Exposures: No mold, hot tub, Jacuzzi.  No feather pillow or comforter No h/o chemo/XRT/macrodantin/MTX  No exposure to asbestos, silica or other organic allergens  Smoking history: Never smoker Travel history: From [place] .  No significant recent travel Family history: No family history of lung disease  Outpatient Encounter Medications as of 07/22/2024  Medication Sig   albuterol  (VENTOLIN  HFA) 108 (90 Base) MCG/ACT inhaler Inhale 2 puffs into the lungs every 6 (six) hours as needed for wheezing or shortness of breath.   amiodarone  (PACERONE ) 200 MG tablet Take 1 tablet (200 mg total) by mouth daily.   apixaban  (ELIQUIS ) 5 MG TABS tablet Take 1 tablet (5 mg total) by mouth 2 (two) times daily.   atorvastatin  (LIPITOR) 40 MG tablet Take 1 tablet by mouth daily.   donepezil  (ARICEPT ) 10 MG tablet Take half tab for 2 weeks, then increase to 1 tab daily   fluticasone  (FLONASE) 50 MCG/ACT nasal spray Place 1 spray into both nostrils 2 (two) times daily as needed for allergies or rhinitis.   fluticasone -salmeterol (ADVAIR  HFA) 230-21 MCG/ACT inhaler Inhale 2 puffs into the lungs 2 (two) times daily.   Metoprolol  Tartrate 37.5 MG TABS Take 1 tablet (37.5 mg total) by mouth 2 (two) times daily.   montelukast  (SINGULAIR ) 10 MG tablet Take 1 tablet (10 mg total) by mouth at bedtime.   spironolactone (ALDACTONE) 50 MG tablet Take 50 mg by mouth daily.  torsemide (DEMADEX) 20 MG tablet Take 20 mg by mouth daily as needed (for edema or fluid retention).    valsartan-hydrochlorothiazide  (DIOVAN-HCT) 320-12.5 MG tablet Take 1 tablet by mouth daily.   No facility-administered encounter medications on file as of 07/22/2024.     Physical Exam: Today's Vitals   07/22/24 0948  BP: (!) 173/78  Pulse: 70  Temp: 98.1 F (36.7 C)  TempSrc: Oral  SpO2: 99%  Weight: 170 lb (77.1 kg)  Height: 5' 1 (1.549 m)   Body mass index is 32.12 kg/m.  Physical Exam GEN: No acute distress. CV: Regular rate and rhythm, no murmurs. LUNGS: Clear to auscultation bilaterally, normal respiratory effort. SKIN JOINTS: Warm and dry, no rash.    Data Reviewed: Imaging: CTA 02/14/2021-right lower lobe segmental and subsegmental pulmonary embolism.  Trace right pleural effusion with patchy airspace disease.  5 mm pulmonary nodule. I have reviewed the images personally.  CT chest report [facility] 09/07/2021-mild subpleural scarring in the right upper lobe, mild scarring in the right base  High-res CT chest 05/08/2024-no evidence of interstitial lung disease.  Mild air trapping.  Skin thickening and retraction of the radial portion of right breast.  Liver appears cirrhotic with area of hyperattenuation in the left lobe. I have reviewed the images personally.  PFTs: Report from [facility] 04/11/2023 FVC: 2.19 (126% predicted), FEV1: 1.42 (107% predicted), Ratio: 0.65, DLCO: 12.1 (66% predicted)  Spirometry and flow volume loop show mild airflow obstruction.   The carbon monoxide diffusing capacity is mildly reduced and is uncorrected for Hgb.  Labs: CBC 02/17/2021-WBC 8.8, eos 3%, absolute eosinophil count 264 04/22/2024-WBC 7.5, eos 4.1%, absolute eosinophil count 308  IgE 04/22/2024-389 Assessment & Plan Asthma Well-controlled with current medication regimen. CT scan shows slight changes of air trapping consistent with asthma, but no significant scarring or interstitial lung disease. - Continue Advair  twice daily - Continue albuterol  as needed - Continue  montelukast   Atrial fibrillation Managed with amiodarone  and anticoagulation therapy. Regular heart rate reported. - Continue amiodarone  - Continue anticoagulation therapy  History of pulmonary embolism Managed with anticoagulation therapy. - Continue anticoagulation therapy  Liver lesion and possible cirrhosis (under evaluation) CT scan suggests possible cirrhosis and liver lesion. No prior liver issues or hepatitis. Last liver test in 2024 was normal. No alcohol use. MRI abdomen recommended for further evaluation. - Ordered MRI of the abdomen  Recommendations: Continue Advair , montelukast , albuterol  as needed MRI abdomen Continue Eliquis   Lonna Coder MD [facility] 07/22/2024, 10:20 AM  CC: Rexanne Ingle, MD  "

## 2024-07-22 NOTE — Patient Instructions (Signed)
" °  VISIT SUMMARY: You had a follow-up visit to check on your asthma and atrial fibrillation. Your asthma is well controlled, and your recent CT scan showed no significant lung issues. Your atrial fibrillation and history of blood clots are being managed with your current medications. We also discussed a liver lesion that needs further evaluation.  YOUR PLAN: ASTHMA: Your asthma is well controlled with your current medications. -Continue taking Advair  twice daily. -Use albuterol  as needed. -Continue taking montelukast .  ATRIAL FIBRILLATION: Your atrial fibrillation is being managed with amiodarone  and anticoagulation therapy. -Continue taking amiodarone . -Continue your anticoagulation therapy.  HISTORY OF PULMONARY EMBOLISM: You have a history of blood clots, which is being managed with anticoagulation therapy. -Continue your anticoagulation therapy.  LIVER LESION AND POSSIBLE CIRRHOSIS: A CT scan suggests a possible liver lesion and cirrhosis. You have no prior liver issues or hepatitis, and your last liver test was normal. -An MRI of your abdomen has been ordered for further evaluation.  "

## 2024-08-19 ENCOUNTER — Encounter: Payer: Self-pay | Admitting: Pulmonary Disease

## 2024-12-16 ENCOUNTER — Ambulatory Visit: Admitting: Physician Assistant
